# Patient Record
Sex: Female | Born: 1980
Health system: Southern US, Community
[De-identification: ages and names within clinical notes are randomized; demographics above are authoritative.]

## PROBLEM LIST (undated history)

## (undated) DIAGNOSIS — R109 Unspecified abdominal pain: Secondary | ICD-10-CM

## (undated) DIAGNOSIS — Z8719 Personal history of other diseases of the digestive system: Secondary | ICD-10-CM

## (undated) DIAGNOSIS — M1990 Unspecified osteoarthritis, unspecified site: Secondary | ICD-10-CM

## (undated) DIAGNOSIS — K219 Gastro-esophageal reflux disease without esophagitis: Secondary | ICD-10-CM

## (undated) HISTORY — DX: Unspecified osteoarthritis, unspecified site: M19.90

## (undated) HISTORY — PX: WISDOM TOOTH EXTRACTION: SHX21

## (undated) HISTORY — PX: APPENDECTOMY: SHX54

## (undated) HISTORY — DX: Unspecified abdominal pain: R10.9

## (undated) HISTORY — PX: EYE SURGERY: SHX253

---

## 2008-09-25 ENCOUNTER — Inpatient Hospital Stay (HOSPITAL_COMMUNITY): Admission: AD | Admit: 2008-09-25 | Discharge: 2008-09-27 | Payer: Self-pay | Admitting: Obstetrics

## 2011-01-06 ENCOUNTER — Inpatient Hospital Stay (HOSPITAL_COMMUNITY)
Admission: AD | Admit: 2011-01-06 | Discharge: 2011-01-09 | Payer: Self-pay | Source: Home / Self Care | Attending: Obstetrics and Gynecology | Admitting: Obstetrics and Gynecology

## 2011-01-07 LAB — CBC
HCT: 32.1 % — ABNORMAL LOW (ref 36.0–46.0)
Hemoglobin: 10.2 g/dL — ABNORMAL LOW (ref 12.0–15.0)
MCH: 24.3 pg — ABNORMAL LOW (ref 26.0–34.0)
MCHC: 31.8 g/dL (ref 30.0–36.0)
MCV: 76.4 fL — ABNORMAL LOW (ref 78.0–100.0)
Platelets: 227 10*3/uL (ref 150–400)
RBC: 4.2 MIL/uL (ref 3.87–5.11)
RDW: 16 % — ABNORMAL HIGH (ref 11.5–15.5)
WBC: 14.6 10*3/uL — ABNORMAL HIGH (ref 4.0–10.5)

## 2011-01-09 LAB — RPR: RPR Ser Ql: NONREACTIVE

## 2011-01-09 LAB — CBC
HCT: 24.8 % — ABNORMAL LOW (ref 36.0–46.0)
Hemoglobin: 7.8 g/dL — ABNORMAL LOW (ref 12.0–15.0)
MCH: 24.5 pg — ABNORMAL LOW (ref 26.0–34.0)
MCHC: 31.5 g/dL (ref 30.0–36.0)
MCV: 78 fL (ref 78.0–100.0)
Platelets: 173 10*3/uL (ref 150–400)
RBC: 3.18 MIL/uL — ABNORMAL LOW (ref 3.87–5.11)
RDW: 16.3 % — ABNORMAL HIGH (ref 11.5–15.5)
WBC: 14.6 10*3/uL — ABNORMAL HIGH (ref 4.0–10.5)

## 2011-01-15 ENCOUNTER — Inpatient Hospital Stay: Admission: RE | Admit: 2011-01-15 | Payer: Self-pay | Source: Home / Self Care | Admitting: Obstetrics

## 2011-01-18 NOTE — Discharge Summary (Addendum)
Casey Larson, Casey Larson            ACCOUNT NO.:  [id number]  MEDICAL RECORD NO.:  [id number]          PATIENT TYPE:  INP  LOCATION:  9109                          FACILITY:  [facility]  PHYSICIAN:  Genia Del, M.D.DATE OF BIRTH:  [birth year]  DATE OF ADMISSION:  01/06/2011 DATE OF DISCHARGE:  01/09/2011                              DISCHARGE SUMMARY   ADMITTING DIAGNOSES:  Term pregnancy, previous cesarean section x2, in labor.  DISCHARGE DIAGNOSIS:  Repeat cesarean section, postop day #2, stable.  PATIENT HISTORY:  The patient is a gravida 3, para 2-0-0-2 at 74 weeks and 3 days' day gestation with a due date of January 17, 2011, prenatal care at [facility] since 7 weeks.  Primary, Dr. Ernestina Penna.  PRENATAL LABORATORY FINDINGS:  A positive, rubella immune, GBS negative, HIV negative, RPR nonreactive, hepatitis B negative and GTT 133.  PRENATAL COURSE: Prenatal course complicated by a previous C-section x2.MEDICAL AND SURGICAL HISTORY:  The patient has a known history of GERD and pubis symphysis disruption, previous C-section x2.  ALLERGIES:  PENICILLIN.  CURRENT MEDICATIONS:  Prenatal vitamins, Zantac and Refresh eye drops.  PRESENTATION:  The patient presents to hospital on January 15 at 9:20 p.m. with contractions, reassuring fetal heart rate.  Cervix was dilated 3, 80, on vertex.  The patient was observed for an additional 3 hours. At 1:10, she was found to be 6, completely effaced and vertex and the patient was prepped for urgent C-section.  ADMISSION LABORATORY FINDINGS:  WBCs 14.6, hemoglobin 10.2, hematocrit 32, platelets 227, RPR nonreactive.  DELIVERY:  The patient was delivered via C-section on January 07, 2011, by Dr. Seymour Bars for previous C-section x2 and active labor; see op note  for further details. The patient was delivered of a female infant, 8 pounds  9 ounces, Apgars 9 and 9. Newborn was transferred to regular nursery.    POST OP COURSE: Postop course was  complicated by ABL anemia and patient  was started on oral iron supplementation.  Postoperative labs;  WBCs 14.6, hemoglobin 7.8, hematocrit 24.8 and platelets 173.   Vital signs remained stable and afebrile throughout hospital stay.   Physical exam was within normal limits. Wound was approximated with  staples.  No erythema and no ecchymosis and no drainage.  Newborn was  bottle fed and underwent a circumcision during hospital stay.  DISCHARGE INSTRUCTIONS:  The patient was discharged home in stable condition, staples for removal on postop day #4 at [facility].  Diet, regular. Activity - postop weight lifting restrictions x2 weeks.  Instructions per [facility] booklet.  DISCHARGE MEDICATIONS: 1. Prenatal vitamins 1 tablet p.o. daily. 2. Colace 1 tablet p.o. daily. 3. Ibuprofen 600 mg p.o. q.6 h. p.r.n. for pain. 4. Tylox 1-2 tablets p.o. q.4-6 h. p.r.n. for pain. 5. Niferex 150 one tablet p.o. b.i.d.  The patient to follow up in 2 days for staple removal and 6 weeks postpartum visit.    ______________________________ Arlan Organ, CNM   ______________________________ Genia Del, M.D.    DP/MEDQ  D:  01/09/2011  T:  01/09/2011  Job:  [id number]  Electronically Signed by Arlan Organ CNM on 01/15/2011 01:11:28 PM Electronically Signed by  Genia Del M.D. on 01/18/2011 09:16:30 AM

## 2011-01-18 NOTE — Op Note (Addendum)
Casey Larson, Casey Larson            ACCOUNT NO.:  [id number]  MEDICAL RECORD NO.:  [id number]          PATIENT TYPE:  INP  LOCATION:  9109                          [facility]  [facility]  PHYSICIAN:  Genia Del, M.D.DATE OF BIRTH:  04/07/1981  DATE OF PROCEDURE:  01/06/2011 DATE OF DISCHARGE:                              OPERATIVE REPORT   PREOPERATIVE DIAGNOSES: 1. 38 weeks and 3 days, active labor. 2. Previous cesarean section.  POSTOPERATIVE DIAGNOSES: 1. 38 weeks and 3 days, active labor. 2. Previous cesarean section.  PROCEDURE:  Urgent repeat low transverse cesarean section.  SURGEON:  Genia Del, MD  ASSISTANT:  None.  DESCRIPTION OF PROCEDURE:  Under spinal anesthesia, the patient was in 15-degree left decubitus position.  She was prepped with the surgery prep on the abdomen and the Betadine on the vulvar and vaginal areas. The Foley was put in place in the bladder and the patient was draped as usual.  The level of anesthesia was verified and it was adequate.  We infiltrated the subcutaneous tissue with Marcaine one-quarter plain 10 mL at the site of the previous Pfannenstiel.  We make a Pfannenstiel incision at that level with a scalpel.  We opened the adipose tissue with a scalpel and then with the electrocautery.  We opened the aponeurosis transversely with Mayo scissors.  We then separate the recti muscles from the aponeurosis on the midline superiorly and inferiorly. We opened the parietal peritoneum with Metz scissors longitudinally.  We then opened the visceral peritoneum transversely with Jen Mow scissors.  We reclined the bladder downward.  We make a low transverse hysterotomy with the scalpel.  The membranes were ruptured, the amniotic fluid was clear and abundant.  We have a fetus in cephalic presentation.  Birth of a baby boy at 1:35.  The baby was suctioned.  The cord was clamped and cut.  The cord blood was taken.  The baby was given to the  neonatal team.  Apgars were 9 and 9, weight was 8 pounds 9 ounces.  The placenta was removed manually.  It was complete with three-vessel, it will be sent to Labor and Delivery.  Uterine revision done. Pitocin in the IV fluids.  The uterus contracts well.  Both ovaries and both tubes were normal to inspection.  Closure of the hysterotomy in a first full locked plane of Vicryl 0, a second layer was done in a mattress plane with Vicryl 0 as well.  Hemostasis was adequate at all levels.  We irrigated and suctioned the abdominopelvic cavities.  We closed the parietal peritoneum with a Vicryl 2-0 in a running suture.  We then completed hemostasis on the aponeurosis and recti muscles with the electrocautery. We closed a small defect on the aponeurosis with a Vicryl 0 in a figure- of-eight.  We then closed the aponeurosis with two-half running sutures of Vicryl 0.  Hemostasis was completed on the adipose tissue with the electrocautery.  We closed the adipose tissue space with separate stitches of plain.  We then reapproximated the skin with staples.  A dry dressing was applied.  The count of sponges and instruments was complete x2.  The  estimated blood loss was 800 mL.  The patient received Flagyl 500 mg IV at the beginning of the intervention.  No complications occurred and she was brought to the recovery room in good stable status.     Genia Del, M.D.     ML/MEDQ  D:  01/07/2011  T:  01/07/2011  Job:  [id number]  Electronically Signed by Genia Del M.D. on 01/18/2011 09:16:27 AM

## 2011-05-07 NOTE — Op Note (Signed)
Casey Larson, Casey Larson            ACCOUNT NO.:  [id number]   MEDICAL RECORD NO.:  [id number]          PATIENT TYPE:  INP   LOCATION:  9128                          [facility]  [facility]   PHYSICIAN:  Maxie Better, M.D.DATE OF BIRTH:  [birth year]   DATE OF PROCEDURE:  09/25/2008  DATE OF DISCHARGE:                               OPERATIVE REPORT   PREOPERATIVE DIAGNOSES:  Previous cesarean section, labor, term  gestation   PROCEDURE:  Repeat cesarean section Kerr hysterotomy.   POSTOPERATIVE DIAGNOSES:  Previous cesarean section in labor, term  gestation.   ANESTHESIA:  Spinal.   SURGEON:  Maxie Better, MD   ASSISTANT:  Marlinda Mike, CNM   INDICATIONS:  A 30 year old gravida 2, para 1 female at term, previous  cesarean section, presented with rupture of membranes in labor at 3 cm  who does not desire  attempted vaginal delivery.  The patient was  consented for repeat cesarean section.  Consent was signed after risks  reviewed.   PROCEDURE:  Under adequate epidural anesthesia, the patient was placed  in the supine position with a left lateral tilt.  She was sterilely  prepped and draped in usual fashion.  An indwelling Foley catheter was  sterilely placed.  A 0.25% Marcaine was injected along the previous  Pfannenstiel skin incision site.  Skin incision was then made, carried  down to the rectus fascia.  Rectus fascia was opened transversely.  Rectus fascia was then bluntly and sharply dissected off the rectus  muscle in superior and inferior fashion.  The rectus muscles was split  in midline.  The parietal peritoneum was entered bluntly and extended.  The vesicouterine peritoneum was opened transversely.  The lower uterine  segment was well developed.  The bladder was then bluntly dissected off  the lower uterine segment and displaced inferiorly.  A curvilinear low  transverse uterine incision was then made and extended with bandage  scissors.  Subsequent delivery of a  live female from the occiput  anterior position was accomplished.  Baby was bulb suctioned on the  abdomen.  The cord was clamped and cut.  The baby was transferred to the  awaiting pediatrician who assigned Apgars of 9 and 9 at 1 and 5 minutes.  Placenta was spontaneous intact, not sent to pathology.  Uterine cavity  was cleaned of debris.  Uterine incision had no extension and closed in  two layers, the first layer with 0 Monocryl running locked stitch and  second layer was imbricated using 0 Monocryl sutures.  Normal tubes and  ovaries were noted bilaterally.  The abdomen was copiously irrigated and  suctioned of debris.  The parietal peritoneum was closed with 2-0  Vicryl.  The rectus fascia was closed with 0 Vicryl x2.  The  subcutaneous area was irrigated.  Small bleeders were cauterized.  Interrupted 2-0 plain sutures were then placed.  The skin was  approximated with Ethicon staples.   SPECIMEN:  Placenta not sent.   ESTIMATED BLOOD LOSS:  500 mL.   URINE OUTPUT:  100 mL clear yellow urine.   INTRAOPERATIVE FLUID:  1800 mL, crystalloid.  Sponge and instrument counts x2 was correct.  Complication was none.  Weight of the baby was 6 pounds 4 ounces.  The patient tolerated  procedure well, was transferred to recovery in stable condition.      Maxie Better, M.D.  Electronically Signed     [place]/MEDQ  D:  09/25/2008  T:  09/25/2008  Job:  [id number]

## 2011-05-10 NOTE — Discharge Summary (Signed)
NAMEARTEMISIA, Larson            ACCOUNT NO.:  [id number]   MEDICAL RECORD NO.:  [id number]          PATIENT TYPE:  INP   LOCATION:  9128                          [facility]   PHYSICIAN:  Maxie Better, M.D.DATE OF BIRTH:  1981-01-10   DATE OF ADMISSION:  09/25/2008  DATE OF DISCHARGE:  09/27/2008                               DISCHARGE SUMMARY   ADMISSION DIAGNOSES:  Previous cesarean section, term gestation, and  labor.   DISCHARGE DIAGNOSES:  Term gestation delivered, labor, previous cesarean  section.   PROCEDURES:  Repeat cesarean section Kerr hysterotomy.   HISTORY OF PRESENT ILLNESS:  A 30 year old gravida 2, para 1 female with  known previous cesarean section scheduled for repeat cesarean section  who presented in labor with spontaneous ruptured membranes.   HOSPITAL COURSE:  The patient was admitted to [facility].  She was  taken to the operating room where she underwent a repeat cesarean  section.  The procedure resulted in delivery of a live female 6 pounds 4  ounces, Apgars of 9 and 9.  Normal tubes and ovaries were noted.  The  patient had a subsequent uncomplicated postoperative course and  requested early discharge on postop day #2.  Her CBC on postop day #1  showed a hemoglobin of 9.1, hematocrit 29.8, white count of 15.6, and  platelet count 219,000.  The patient is deemed well to be discharged  home.  Her incision had no evidence of an infection.   DISPOSITION:  Home.   CONDITION:  Stable.   DISCHARGE MEDICATIONS:  1. Motrin 800 mg every 8 hours p.r.n. pain.  2. Darvocet once 2 tablets every 4-6 hours p.r.n. pain.  3. Prenatal vitamins 1 p.o. daily.  4. Over-the-counter iron supplements 1 p.o. daily.   DISCHARGE INSTRUCTIONS:  Per the postpartum booklet given.  Followup  appointment at [facility] for staple removal in 6 weeks postpartum  otherwise.      Maxie Better, M.D.  Electronically Signed     [place]/MEDQ  D:   11/01/2008  T:  11/02/2008  Job:  [id number]

## 2011-09-23 LAB — CBC
HCT: 29.8 — ABNORMAL LOW
HCT: 34.9 — ABNORMAL LOW
Hemoglobin: 11.3 — ABNORMAL LOW
Hemoglobin: 9.9 — ABNORMAL LOW
MCHC: 32.4
MCHC: 33.2
MCV: 85.9
MCV: 87.6
Platelets: 219
Platelets: 292
RBC: 3.4 — ABNORMAL LOW
RBC: 4.06
RDW: 14.1
RDW: 14.6
WBC: 14.2 — ABNORMAL HIGH
WBC: 15.6 — ABNORMAL HIGH

## 2011-09-23 LAB — RPR: RPR Ser Ql: NONREACTIVE

## 2011-11-22 ENCOUNTER — Encounter: Payer: Self-pay | Admitting: Emergency Medicine

## 2011-11-22 ENCOUNTER — Emergency Department (INDEPENDENT_AMBULATORY_CARE_PROVIDER_SITE_OTHER)
Admission: EM | Admit: 2011-11-22 | Discharge: 2011-11-22 | Disposition: A | Discharge status: Other Acute Inpatient Hospital | Payer: BC Managed Care – PPO | Source: Home / Self Care | Attending: Emergency Medicine | Admitting: Emergency Medicine

## 2011-11-22 DIAGNOSIS — R102 Pelvic and perineal pain: Secondary | ICD-10-CM

## 2011-11-22 DIAGNOSIS — N949 Unspecified condition associated with female genital organs and menstrual cycle: Secondary | ICD-10-CM

## 2011-11-22 LAB — POCT URINALYSIS DIP (DEVICE)
Bilirubin Urine: NEGATIVE
Glucose, UA: NEGATIVE mg/dL
Nitrite: NEGATIVE
Protein, ur: NEGATIVE mg/dL
Specific Gravity, Urine: 1.025 (ref 1.005–1.030)
Urobilinogen, UA: 1 mg/dL (ref 0.0–1.0)
pH: 6.5 (ref 5.0–8.0)

## 2011-11-22 LAB — WET PREP, GENITAL
Trich, Wet Prep: NONE SEEN
Yeast Wet Prep HPF POC: NONE SEEN

## 2011-11-22 LAB — POCT PREGNANCY, URINE: Preg Test, Ur: NEGATIVE

## 2011-11-22 MED ORDER — IBUPROFEN 800 MG PO TABS: 800.0000 mg | ORAL_TABLET | Freq: Three times a day (TID) | ORAL | Status: AC

## 2011-11-22 MED ORDER — METRONIDAZOLE 500 MG PO TABS: 500.0000 mg | ORAL_TABLET | Freq: Three times a day (TID) | ORAL | Status: AC

## 2011-11-22 MED ORDER — DOXYCYCLINE HYCLATE 100 MG PO TABS: 100.0000 mg | ORAL_TABLET | Freq: Two times a day (BID) | ORAL | Status: AC

## 2011-11-22 NOTE — ED Notes (Signed)
Since Sat having pain in lower abdomen pelvic area mostly on left. Also radiates to back and hip on Left side.

## 2011-11-22 NOTE — ED Provider Notes (Signed)
History     CSN: [id number] Arrival date & time: 11/22/2011  9:10 PM   First MD Initiated Contact with Patient 11/22/11 1958      Chief Complaint  Patient presents with  . Abdominal Pain    (Consider location/radiation/quality/duration/timing/severity/associated sxs/prior treatment) HPI Comments: Casey Larson has had a one-week history of left lower quadrant abdominal pain radiating to the left lower back and down into the left thigh. The pain is a constant ache with occasional sharp stabbing pain. The pain is 8/10 in intensity at its worst and now is a 5/10. It's worse if she sits or if she urinates and better she stands up. Her last menstrual period was November 21. She has an IUD. Menses occur about every 3 months. She did have a history of an ovarian cyst when she was pregnant. Her gynecologist is Dr. Algie Coffer. She has allergies to penicillin and codeine. She denies any fever, chills, anorexia, weight loss, nausea, or vomiting. She's had no constipation, diarrhea, blood in the stool, or melena. She denies any dysuria, frequency, urgency, or blood in the urine. She has had no vaginal discharge or itching.  Patient is a 30 y.o. female presenting with abdominal pain.  Abdominal Pain The primary symptoms of the illness include abdominal pain. The primary symptoms of the illness do not include fever, nausea, vomiting, diarrhea, dysuria, vaginal discharge or vaginal bleeding.  Symptoms associated with the illness do not include chills, urgency, hematuria or frequency.    History reviewed. No pertinent past medical history.  Past Surgical History  Procedure Date  . Cesarean section 3x last being 01/12  . Eye surgery     History reviewed. No pertinent family history.  History  Substance Use Topics  . Smoking status: Not on file  . Smokeless tobacco: Not on file  . Alcohol Use: No    OB History    Grav Para Term Preterm Abortions TAB SAB Ect Mult Living                  Review of  Systems  Constitutional: Negative for fever and chills.  Gastrointestinal: Positive for abdominal pain. Negative for nausea, vomiting and diarrhea.  Genitourinary: Negative for dysuria, urgency, frequency, hematuria, vaginal bleeding, vaginal discharge, difficulty urinating, genital sores, vaginal pain, menstrual problem, pelvic pain and dyspareunia.    Allergies  Codeine and Penicillins  Home Medications   Current Outpatient Rx  Name Route Sig Dispense Refill  . DOXYCYCLINE HYCLATE 100 MG PO TABS Oral Take 1 tablet (100 mg total) by mouth 2 (two) times daily. 20 tablet 0  . IBUPROFEN 800 MG PO TABS Oral Take 1 tablet (800 mg total) by mouth 3 (three) times daily. 21 tablet 0  . METRONIDAZOLE 500 MG PO TABS Oral Take 1 tablet (500 mg total) by mouth 3 (three) times daily. 30 tablet 0    BP 113/75  Pulse 85  Temp(Src) 97.7 F (36.5 C) (Oral)  Resp 18  SpO2 100%  LMP 11/13/2011  Physical Exam  Nursing note and vitals reviewed. Constitutional: She appears well-developed and well-nourished. No distress.  Cardiovascular: Normal rate, regular rhythm, normal heart sounds and intact distal pulses.  Exam reveals no gallop and no friction rub.   No murmur heard. Pulmonary/Chest: Effort normal and breath sounds normal. No respiratory distress. She has no wheezes. She has no rales.  Abdominal: Soft. Bowel sounds are normal. She exhibits no distension and no mass. There is no tenderness. There is no rebound and no guarding.  Genitourinary: Uterus normal. There is no rash or lesion on the right labia. There is no rash or lesion on the left labia. Uterus is not deviated, not enlarged, not fixed and not tender. Cervix exhibits discharge. Cervix exhibits no motion tenderness and no friability. Right adnexum displays no mass and no tenderness. Left adnexum displays tenderness. Left adnexum displays no mass. No erythema or bleeding around the vagina. No foreign body around the vagina. Vaginal discharge  found.       On pelvic exam, external genitalia were normal. She has a thick, homogeneous, white, non-malodorous discharge. She does have an IUD string in place. There was no cervical motion tenderness. Uterus was midposition, normal in size and shape and was nontender. There is no right adnexal tenderness or mass. She has a moderate left adnexal tenderness but no mass.  Skin: Skin is warm and dry. No rash noted. She is not diaphoretic.    ED Course  Procedures (including critical care time)  Results for orders placed during the hospital encounter of 11/22/11  POCT URINALYSIS DIP (DEVICE)      Component Value Range   Glucose, UA NEGATIVE  NEGATIVE (mg/dL)   Bilirubin Urine NEGATIVE  NEGATIVE    Ketones, ur TRACE (*) NEGATIVE (mg/dL)   Specific Gravity, Urine 1.025  1.005 - 1.030    Hgb urine dipstick SMALL (*) NEGATIVE    pH 6.5  5.0 - 8.0    Protein, ur NEGATIVE  NEGATIVE (mg/dL)   Urobilinogen, UA 1.0  0.0 - 1.0 (mg/dL)   Nitrite NEGATIVE  NEGATIVE    Leukocytes, UA TRACE (*) NEGATIVE   POCT PREGNANCY, URINE      Component Value Range   Preg Test, Ur NEGATIVE       Labs Reviewed  POCT URINALYSIS DIP (DEVICE) - Abnormal; Notable for the following:    Ketones, ur TRACE (*)    Hgb urine dipstick SMALL (*)    Leukocytes, UA TRACE (*) Biochemical Testing Only. Please order routine urinalysis from main lab if confirmatory testing is needed.   All other components within normal limits  POCT PREGNANCY, URINE  POCT PREGNANCY, URINE  POCT URINALYSIS DIPSTICK  WET PREP, GENITAL  GC/CHLAMYDIA PROBE AMP, GENITAL   No results found.   1. Pelvic pain in female       MDM  Her clinical picture and examination are consistent with either an ovarian cyst that has ruptured or with a tubal infection. I told her I could not make the differentiation without ultrasound. For right now we'll go ahead and start on antibiotics and have her followup with her gynecologist on Monday. If the pain  gets worse over the weekend I suggested she go to [facility] where they can do an ultrasound.        Roque Lias, MD 11/22/11 [phone]

## 2011-11-25 LAB — GC/CHLAMYDIA PROBE AMP, GENITAL
Chlamydia, DNA Probe: NEGATIVE
GC Probe Amp, Genital: NEGATIVE

## 2011-11-25 NOTE — ED Notes (Addendum)
Wet prep: Clue cells TNTC  GC/Chlamydia neg.  Pt. adequately treated with Flagyl for bacterial vaginosis. [name] 11/25/2011  .

## 2012-10-02 ENCOUNTER — Encounter: Payer: Self-pay | Admitting: Family

## 2012-10-02 ENCOUNTER — Ambulatory Visit (INDEPENDENT_AMBULATORY_CARE_PROVIDER_SITE_OTHER): Payer: BC Managed Care – PPO | Admitting: Family

## 2012-10-02 VITALS — BP 104/70 | HR 75 | Temp 97.9°F | Resp 12 | Ht 64.0 in | Wt 133.0 lb

## 2012-10-02 DIAGNOSIS — N63 Unspecified lump in unspecified breast: Secondary | ICD-10-CM

## 2012-10-02 DIAGNOSIS — J069 Acute upper respiratory infection, unspecified: Secondary | ICD-10-CM

## 2012-10-02 DIAGNOSIS — N6320 Unspecified lump in the left breast, unspecified quadrant: Secondary | ICD-10-CM

## 2012-10-02 MED ORDER — LEVOFLOXACIN 500 MG PO TABS: 500.0000 mg | ORAL_TABLET | Freq: Every day | ORAL | Status: DC

## 2012-10-02 NOTE — Assessment & Plan Note (Signed)
Nasal congestion is most consistent with URI, though I have instructed her to start levaquin if symptoms worsen or if no improvement by Monday. Pt verbalizes understanding.

## 2012-10-02 NOTE — Assessment & Plan Note (Signed)
No discrete mass noted today on exam, however will go ahead and order left diagnostic mammogram.  She reports husband felt mass last night.  Resolving cyst is a possibility.

## 2012-10-02 NOTE — Patient Instructions (Addendum)
You will be contacted about your mammogram.  Please call if you have not heard back about your appointment by Tuesday. Please start antibiotics if sinus congestion is not improved by 10/14, sooner if you develop green/yellow nasal discharge or if you develop fever.  Please schedule a fasting physical at your convenience.

## 2012-10-02 NOTE — Progress Notes (Signed)
Subjective:    Patient ID: Casey Larson, female    DOB: 24-Mar-1981, 31 y.o.   MRN: [id number]  HPI  Ms. Casey Larson is a 31 yr old female who presents today to establish care. She has two concerns.  1) lump in left breast- first noticed on 10/4. Left breast under the breast.  Lump is tenderness.  No associated redness or drainage from the  Left breast. Denies family hx of breast cancer.    2) Sinus problem-  Reports 5 day history of nasal congestion-  Reports nasal congestion is clear. She felt cold on Monday and Tuesday but did not take temperature.  She has tried dayquil, Careers adviser D without much improvement. Tried advil for HA which helped.     Review of Systems  Constitutional: Negative for unexpected weight change.  HENT: Negative for hearing loss.   Eyes: Negative for visual disturbance.  Respiratory: Negative for chest tightness and shortness of breath.   Cardiovascular: Negative for leg swelling.  Gastrointestinal: Negative for nausea, vomiting, diarrhea and constipation.  Genitourinary: Negative for frequency and hematuria.  Musculoskeletal: Negative for myalgias and arthralgias.  Skin: Negative for rash.  Neurological: Positive for headaches.  Psychiatric/Behavioral:       Denies depression/anxiety   History reviewed. No pertinent past medical history.  History   Social History  . Marital Status: Married    Spouse Name: N/A    Number of Children: 3  . Years of Education: N/A   Occupational History  .     Social History Main Topics  . Smoking status: Never Smoker   . Smokeless tobacco: Not on file  . Alcohol Use: No  . Drug Use: No  . Sexually Active: Yes    Birth Control/ Protection: IUD     IUD placed 01/2011   Other Topics Concern  . Not on file   Social History Narrative   Regular exercise: noCaffeine use: tea 3 to 4 x a weekStay at home mom- 3 childrenCompleted 2 associates degree.Married.    Past Surgical History  Procedure Date  . Cesarean  section 3x last being 01/12  . Eye surgery     lasik    Family History  Problem Relation Age of Onset  . Diabetes Father   . Cancer Maternal Grandmother     lung cancer    Allergies  Allergen Reactions  . Codeine     Syncope   . Penicillins Hives    No current outpatient prescriptions on file prior to visit.    BP 104/70  Pulse 75  Temp 97.9 F (36.6 C) (Oral)  Resp 12  Ht 5\' 4"  (1.626 m)  Wt 133 lb 0.6 oz (60.347 kg)  BMI 22.84 kg/m2  SpO2 99%  LMP 09/28/2012       Objective:   Physical Exam  Constitutional: She is oriented to person, place, and time. She appears well-developed and well-nourished. No distress.  Cardiovascular: Normal rate and regular rhythm.   No murmur heard. Pulmonary/Chest: Effort normal and breath sounds normal. No respiratory distress. She has no wheezes. She has no rales. She exhibits no tenderness.  Genitourinary:       Bilateral cystic breasts are noted.  No discrete left breast mass is palpable today, though she does report some tenderness.  Musculoskeletal: She exhibits no edema.  Neurological: She is alert and oriented to person, place, and time.  Skin: Skin is warm and dry.  Psychiatric: She has a normal mood and affect. Her behavior is  normal. Judgment and thought content normal.          Assessment & Plan:

## 2012-10-08 ENCOUNTER — Other Ambulatory Visit: Payer: Self-pay | Admitting: Family

## 2012-10-08 DIAGNOSIS — N6320 Unspecified lump in the left breast, unspecified quadrant: Secondary | ICD-10-CM

## 2012-10-12 ENCOUNTER — Ambulatory Visit
Admission: RE | Admit: 2012-10-12 | Discharge: 2012-10-12 | Disposition: A | Discharge status: Home / Self Care | Payer: BC Managed Care – PPO | Source: Ambulatory Visit | Attending: Family | Admitting: Family

## 2012-10-12 DIAGNOSIS — N6320 Unspecified lump in the left breast, unspecified quadrant: Secondary | ICD-10-CM

## 2012-10-13 ENCOUNTER — Telehealth: Payer: Self-pay | Admitting: *Deleted

## 2012-10-13 NOTE — Telephone Encounter (Signed)
Left detailed voice message for pt on home voice mail that her breast imaging is normal.

## 2014-03-10 ENCOUNTER — Encounter: Payer: Self-pay | Admitting: Physician Assistant

## 2014-03-10 ENCOUNTER — Ambulatory Visit (INDEPENDENT_AMBULATORY_CARE_PROVIDER_SITE_OTHER): Payer: BC Managed Care – PPO | Admitting: Physician Assistant

## 2014-03-10 VITALS — BP 104/76 | HR 89 | Temp 98.2°F | Resp 16 | Ht 64.0 in | Wt 137.0 lb

## 2014-03-10 DIAGNOSIS — R42 Dizziness and giddiness: Secondary | ICD-10-CM

## 2014-03-10 DIAGNOSIS — R110 Nausea: Secondary | ICD-10-CM

## 2014-03-10 DIAGNOSIS — R55 Syncope and collapse: Secondary | ICD-10-CM

## 2014-03-10 LAB — CBC WITH DIFFERENTIAL/PLATELET
Basophils Absolute: 0 10*3/uL (ref 0.0–0.1)
Basophils Relative: 0 % (ref 0–1)
Eosinophils Absolute: 0.2 10*3/uL (ref 0.0–0.7)
Eosinophils Relative: 2 % (ref 0–5)
HCT: 39.3 % (ref 36.0–46.0)
Hemoglobin: 13.6 g/dL (ref 12.0–15.0)
Lymphocytes Relative: 28 % (ref 12–46)
Lymphs Abs: 2.4 10*3/uL (ref 0.7–4.0)
MCH: 30.2 pg (ref 26.0–34.0)
MCHC: 34.6 g/dL (ref 30.0–36.0)
MCV: 87.3 fL (ref 78.0–100.0)
Monocytes Absolute: 0.5 10*3/uL (ref 0.1–1.0)
Monocytes Relative: 6 % (ref 3–12)
Neutro Abs: 5.4 10*3/uL (ref 1.7–7.7)
Neutrophils Relative %: 64 % (ref 43–77)
Platelets: 274 10*3/uL (ref 150–400)
RBC: 4.5 MIL/uL (ref 3.87–5.11)
RDW: 13.9 % (ref 11.5–15.5)
WBC: 8.4 10*3/uL (ref 4.0–10.5)

## 2014-03-10 LAB — COMPREHENSIVE METABOLIC PANEL
ALT: 9 U/L (ref 0–35)
AST: 12 U/L (ref 0–37)
Albumin: 4.2 g/dL (ref 3.5–5.2)
Alkaline Phosphatase: 56 U/L (ref 39–117)
BUN: 9 mg/dL (ref 6–23)
CO2: 28 mEq/L (ref 19–32)
Calcium: 9 mg/dL (ref 8.4–10.5)
Chloride: 104 mEq/L (ref 96–112)
Creat: 0.63 mg/dL (ref 0.50–1.10)
Glucose, Bld: 103 mg/dL — ABNORMAL HIGH (ref 70–99)
Potassium: 4.2 mEq/L (ref 3.5–5.3)
Sodium: 137 mEq/L (ref 135–145)
Total Bilirubin: 0.4 mg/dL (ref 0.2–1.2)
Total Protein: 7.4 g/dL (ref 6.0–8.3)

## 2014-03-10 LAB — SEDIMENTATION RATE: Sed Rate: 4 mm/hr (ref 0–22)

## 2014-03-10 NOTE — Progress Notes (Signed)
Pre visit review using our clinic review tool, if applicable. No additional management support is needed unless otherwise documented below in the visit note/SLS  

## 2014-03-10 NOTE — Patient Instructions (Signed)
Please increase fluid intake.  Drink 1 Gatorade per day.  Please obtain labs.  I will call you with your results.  Watch for your symptoms -- nausea or lightheadedness.  If this occurs, sit down and have some one bring you a water.  If labs are good and increasing hydration are not helping, we will send you to Neurology for a further evaluation.

## 2014-03-10 NOTE — Progress Notes (Signed)
Patient presents to clinic today c/o episodes of lightheadedness, nausea, tunnel vision and weakness that have been occuring since September 2014.  Episodes are infrequent.  Patient states they usually occur if she gets too hot or if she is stressed.  Denies facial drooping, AMS, or focal neurologic deficit.  Denies hx of seizure, HTN, HLD, CVD.  Denies hx of seizure.  Denies syncope.  Symptoms usually resolve in 10-15 minutes and after she has sat down and been given something to drink.  Patient and husband both endorse that the patient has very poor fluid intake and a hx of iron deficiency anemia. States she may have 1/2-1 glass of fluids per day.  Does endorse eating regularly.   No past medical history on file.  No current outpatient prescriptions on file prior to visit.   No current facility-administered medications on file prior to visit.    Allergies  Allergen Reactions  . Codeine     Syncope   . Penicillins Hives    Family History  Problem Relation Age of Onset  . Diabetes Father   . Cancer Maternal Grandmother     lung cancer    History   Social History  . Marital Status: Married    Spouse Name: N/A    Number of Children: 3  . Years of Education: N/A   Occupational History  .     Social History Main Topics  . Smoking status: Never Smoker   . Smokeless tobacco: None  . Alcohol Use: No  . Drug Use: No  . Sexual Activity: Yes    Birth Control/ Protection: IUD     Comment: IUD placed 01/2011   Other Topics Concern  . 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   Married.         Review of Systems - See HPI.  All other ROS are negative.  BP 104/76  Pulse 89  Temp(Src) 98.2 F (36.8 C) (Oral)  Resp 16  Ht '5\' 4"'  (1.626 m)  Wt 137 lb (62.143 kg)  BMI 23.50 kg/m2  SpO2 97%  LMP 03/05/2014  Physical Exam  Vitals reviewed. Constitutional:  She is oriented to person, place, and time and well-developed, well-nourished, and in no distress.  HENT:  Head: Normocephalic and atraumatic.  Right Ear: External ear normal.  Left Ear: External ear normal.  Nose: Nose normal.  Mouth/Throat: Oropharynx is clear and moist. No oropharyngeal exudate.  TM within normal limits bilaterally.  Eyes: Conjunctivae are normal. Pupils are equal, round, and reactive to light.  Neck: Neck supple.  Cardiovascular: Normal rate, regular rhythm, S1 normal, S2 normal, normal heart sounds and intact distal pulses.  Exam reveals no S3 and no S4.   Pulses:      Carotid pulses are 2+ on the right side, and 2+ on the left side. No carotid bruit auscultated on exam.  Pulmonary/Chest: Effort normal and breath sounds normal. No respiratory distress. She has no wheezes. She has no rales. She exhibits no tenderness.  Musculoskeletal: Normal range of motion.  Neurological: She is alert and oriented to person, place, and time. She has normal reflexes. No cranial nerve deficit. Gait normal. GCS score is 15.  Skin: Skin is warm and dry. No rash noted.  Psychiatric: Mood, memory, affect and judgment normal.    Recent Results (from  the past 2160 hour(s))  CBC WITH DIFFERENTIAL     Status: None   Collection Time    03/10/14  4:06 PM      Result Value Ref Range   WBC 8.4  4.0 - 10.5 K/uL   RBC 4.50  3.87 - 5.11 MIL/uL   Hemoglobin 13.6  12.0 - 15.0 g/dL   HCT 39.3  36.0 - 46.0 %   MCV 87.3  78.0 - 100.0 fL   MCH 30.2  26.0 - 34.0 pg   MCHC 34.6  30.0 - 36.0 g/dL   RDW 13.9  11.5 - 15.5 %   Platelets 274  150 - 400 K/uL   Neutrophils Relative % 64  43 - 77 %   Neutro Abs 5.4  1.7 - 7.7 K/uL   Lymphocytes Relative 28  12 - 46 %   Lymphs Abs 2.4  0.7 - 4.0 K/uL   Monocytes Relative 6  3 - 12 %   Monocytes Absolute 0.5  0.1 - 1.0 K/uL   Eosinophils Relative 2  0 - 5 %   Eosinophils Absolute 0.2  0.0 - 0.7 K/uL   Basophils Relative 0  0 - 1 %   Basophils Absolute  0.0  0.0 - 0.1 K/uL   Smear Review Criteria for review not met    COMPREHENSIVE METABOLIC PANEL     Status: Abnormal   Collection Time    03/10/14  4:06 PM      Result Value Ref Range   Sodium 137  135 - 145 mEq/L   Potassium 4.2  3.5 - 5.3 mEq/L   Chloride 104  96 - 112 mEq/L   CO2 28  19 - 32 mEq/L   Glucose, Bld 103 (*) 70 - 99 mg/dL   BUN 9  6 - 23 mg/dL   Creat 0.63  0.50 - 1.10 mg/dL   Total Bilirubin 0.4  0.2 - 1.2 mg/dL   Alkaline Phosphatase 56  39 - 117 U/L   AST 12  0 - 37 U/L   ALT 9  0 - 35 U/L   Total Protein 7.4  6.0 - 8.3 g/dL   Albumin 4.2  3.5 - 5.2 g/dL   Calcium 9.0  8.4 - 10.5 mg/dL  SEDIMENTATION RATE     Status: None   Collection Time    03/10/14  4:06 PM      Result Value Ref Range   Sed Rate 4  0 - 22 mm/hr    Assessment/Plan: Vasovagal near syncope Symptoms seem consistent with Vasovagal Episodes.  Physical examination unremarkable.Episodes are likely due to extremely poor hydration combined with triggers.  Patient also with Hx of IDA.  Increase fluid intake to 6-8 glasses of fluid per day.  Recommend 1 Gatorade per day.  Will obtain labs due to history of anemia. Follow-up in 1 month.  Return sooner if symptoms recur despite increased hydration.

## 2014-03-12 DIAGNOSIS — R55 Syncope and collapse: Secondary | ICD-10-CM | POA: Insufficient documentation

## 2014-03-12 NOTE — Assessment & Plan Note (Signed)
Symptoms seem consistent with Vasovagal Episodes.  Physical examination unremarkable.Episodes are likely due to extremely poor hydration combined with triggers.  Patient also with Hx of IDA.  Increase fluid intake to 6-8 glasses of fluid per day.  Recommend 1 Gatorade per day.  Will obtain labs due to history of anemia. Follow-up in 1 month.  Return sooner if symptoms recur despite increased hydration.

## 2016-12-19 ENCOUNTER — Ambulatory Visit (HOSPITAL_COMMUNITY)
Admission: EM | Admit: 2016-12-19 | Discharge: 2016-12-19 | Disposition: A | Discharge status: Home / Self Care | Payer: BLUE CROSS/BLUE SHIELD | Attending: Family Medicine | Admitting: Family Medicine

## 2016-12-19 ENCOUNTER — Encounter (HOSPITAL_COMMUNITY): Payer: Self-pay | Admitting: Family Medicine

## 2016-12-19 DIAGNOSIS — J40 Bronchitis, not specified as acute or chronic: Secondary | ICD-10-CM | POA: Diagnosis not present

## 2016-12-19 DIAGNOSIS — R05 Cough: Secondary | ICD-10-CM

## 2016-12-19 DIAGNOSIS — R059 Cough, unspecified: Secondary | ICD-10-CM

## 2016-12-19 MED ORDER — AZITHROMYCIN 250 MG PO TABS
250.0000 mg | ORAL_TABLET | Freq: Every day | ORAL | 0 refills | Status: DC
Start: 2016-12-19 — End: 2017-12-30

## 2016-12-19 MED ORDER — BENZONATATE 100 MG PO CAPS: 200.0000 mg | ORAL_CAPSULE | Freq: Three times a day (TID) | ORAL | 0 refills | Status: DC | PRN

## 2016-12-19 MED ORDER — IPRATROPIUM BROMIDE 0.06 % NA SOLN: 2.0000 | Freq: Four times a day (QID) | NASAL | 12 refills | Status: DC

## 2016-12-19 NOTE — ED Provider Notes (Signed)
CSN: [id number]     Arrival date & time 12/19/16  1307 History   First MD Initiated Contact with Patient 12/19/16 1447     Chief Complaint  Patient presents with  . Cough  . Chills   (Consider location/radiation/quality/duration/timing/severity/associated sxs/prior Treatment) Patient c/o uri cough congestion and feeling washed out.  She has hoarse voice.    URI  Presenting symptoms: congestion, cough, fatigue and rhinorrhea   Severity:  Moderate Onset quality:  Sudden Duration:  1 week Timing:  Constant Progression:  Worsening Chronicity:  New Relieved by:  Nothing Worsened by:  Nothing   History reviewed. No pertinent past medical history. Past Surgical History:  Procedure Laterality Date  . CESAREAN SECTION  3x last being 01/12  . EYE SURGERY     lasik   Family History  Problem Relation Age of Onset  . Diabetes Father   . Cancer Maternal Grandmother     lung cancer   Social History  Substance Use Topics  . Smoking status: Never Smoker  . Smokeless tobacco: Never Used  . Alcohol use No   OB History    No data available     Review of Systems  Constitutional: Positive for fatigue.  HENT: Positive for congestion and rhinorrhea.   Eyes: Negative.   Respiratory: Positive for cough.   Cardiovascular: Negative.   Gastrointestinal: Negative.   Endocrine: Negative.   Genitourinary: Negative.   Musculoskeletal: Negative.   Allergic/Immunologic: Negative.   Neurological: Negative.   Hematological: Negative.   Psychiatric/Behavioral: Negative.     Allergies  Codeine and Penicillins  Home Medications   Prior to Admission medications   Medication Sig Start Date End Date Taking? Authorizing Provider  azithromycin (ZITHROMAX) 250 MG tablet Take 1 tablet (250 mg total) by mouth daily. Take first 2 tablets together, then 1 every day until finished. 12/19/16   Deatra CanterWilliam J Oxford, FNP  benzonatate (TESSALON) 100 MG capsule Take 2 capsules (200 mg total) by mouth 3  (three) times daily as needed for cough. 12/19/16   Deatra CanterWilliam J Oxford, FNP  ipratropium (ATROVENT) 0.06 % nasal spray Place 2 sprays into both nostrils 4 (four) times daily. 12/19/16   Deatra CanterWilliam J Oxford, FNP   Meds Ordered and Administered this Visit  Medications - No data to display  BP 98/77   Pulse 108   Temp 98.2 F (36.8 C)   Resp 18   SpO2 100%  No data found.   Physical Exam  Constitutional: She is oriented to person, place, and time. She appears well-developed and well-nourished.  HENT:  Head: Normocephalic and atraumatic.  Right Ear: External ear normal.  Left Ear: External ear normal.  Mouth/Throat: Oropharynx is clear and moist.  Eyes: Conjunctivae and EOM are normal. Pupils are equal, round, and reactive to light.  Neck: Normal range of motion. Neck supple.  Cardiovascular: Normal rate, regular rhythm and normal heart sounds.   Pulmonary/Chest: Effort normal and breath sounds normal.  Abdominal: Soft. Bowel sounds are normal.  Neurological: She is alert and oriented to person, place, and time.  Nursing note and vitals reviewed.   Urgent Care Course   Clinical Course     Procedures (including critical care time)  Labs Review Labs Reviewed - No data to display  Imaging Review No results found.   Visual Acuity Review  Right Eye Distance:   Left Eye Distance:   Bilateral Distance:    Right Eye Near:   Left Eye Near:    Bilateral Near:  MDM   1. Bronchitis   2. Cough    zpak as directed atrovent nasal spray Tessalon perles  Push po fluids, rest, tylenol and motrin otc prn as directed for fever, arthralgias, and myalgias.  Follow up prn if sx's continue or persist.    Deatra CanterWilliam J Oxford, FNP 12/19/16 1538

## 2016-12-19 NOTE — ED Triage Notes (Signed)
Pt here for flu like symptoms. sts congestion x 2 weeks.

## 2017-12-30 ENCOUNTER — Inpatient Hospital Stay (HOSPITAL_COMMUNITY)
Admission: EM | Admit: 2017-12-30 | Discharge: 2018-01-01 | DRG: 343 | Disposition: A | Discharge status: Home / Self Care | Payer: BLUE CROSS/BLUE SHIELD | Attending: General Surgery | Admitting: General Surgery

## 2017-12-30 ENCOUNTER — Emergency Department (HOSPITAL_COMMUNITY): Payer: BLUE CROSS/BLUE SHIELD

## 2017-12-30 ENCOUNTER — Encounter (HOSPITAL_COMMUNITY): Payer: Self-pay | Admitting: *Deleted

## 2017-12-30 DIAGNOSIS — Z833 Family history of diabetes mellitus: Secondary | ICD-10-CM

## 2017-12-30 DIAGNOSIS — R1110 Vomiting, unspecified: Secondary | ICD-10-CM | POA: Diagnosis not present

## 2017-12-30 DIAGNOSIS — K3580 Unspecified acute appendicitis: Secondary | ICD-10-CM | POA: Diagnosis not present

## 2017-12-30 DIAGNOSIS — Z801 Family history of malignant neoplasm of trachea, bronchus and lung: Secondary | ICD-10-CM | POA: Diagnosis not present

## 2017-12-30 DIAGNOSIS — R1031 Right lower quadrant pain: Secondary | ICD-10-CM | POA: Diagnosis not present

## 2017-12-30 DIAGNOSIS — Z880 Allergy status to penicillin: Secondary | ICD-10-CM | POA: Diagnosis not present

## 2017-12-30 DIAGNOSIS — Z885 Allergy status to narcotic agent status: Secondary | ICD-10-CM

## 2017-12-30 DIAGNOSIS — K37 Unspecified appendicitis: Secondary | ICD-10-CM | POA: Diagnosis present

## 2017-12-30 DIAGNOSIS — K3589 Other acute appendicitis without perforation or gangrene: Secondary | ICD-10-CM | POA: Diagnosis not present

## 2017-12-30 DIAGNOSIS — R110 Nausea: Secondary | ICD-10-CM | POA: Diagnosis not present

## 2017-12-30 DIAGNOSIS — R109 Unspecified abdominal pain: Secondary | ICD-10-CM | POA: Diagnosis not present

## 2017-12-30 DIAGNOSIS — R6883 Chills (without fever): Secondary | ICD-10-CM | POA: Diagnosis not present

## 2017-12-30 LAB — URINALYSIS, ROUTINE W REFLEX MICROSCOPIC
BILIRUBIN URINE: NEGATIVE
GLUCOSE, UA: NEGATIVE mg/dL
KETONES UR: NEGATIVE mg/dL
LEUKOCYTES UA: NEGATIVE
Nitrite: NEGATIVE
Protein, ur: NEGATIVE mg/dL
Specific Gravity, Urine: 1.008 (ref 1.005–1.030)
pH: 6 (ref 5.0–8.0)

## 2017-12-30 LAB — CBC WITH DIFFERENTIAL/PLATELET
BASOS ABS: 0 10*3/uL (ref 0.0–0.1)
BASOS PCT: 0 %
EOS PCT: 1 %
Eosinophils Absolute: 0.1 10*3/uL (ref 0.0–0.7)
HEMATOCRIT: 42.1 % (ref 36.0–46.0)
Hemoglobin: 14.1 g/dL (ref 12.0–15.0)
Lymphocytes Relative: 26 %
Lymphs Abs: 2.5 10*3/uL (ref 0.7–4.0)
MCH: 30.5 pg (ref 26.0–34.0)
MCHC: 33.5 g/dL (ref 30.0–36.0)
MCV: 91.1 fL (ref 78.0–100.0)
MONO ABS: 0.5 10*3/uL (ref 0.1–1.0)
MONOS PCT: 5 %
NEUTROS ABS: 6.7 10*3/uL (ref 1.7–7.7)
Neutrophils Relative %: 68 %
PLATELETS: 294 10*3/uL (ref 150–400)
RBC: 4.62 MIL/uL (ref 3.87–5.11)
RDW: 13.4 % (ref 11.5–15.5)
WBC: 9.7 10*3/uL (ref 4.0–10.5)

## 2017-12-30 LAB — PREGNANCY, URINE: PREG TEST UR: NEGATIVE

## 2017-12-30 LAB — COMPREHENSIVE METABOLIC PANEL
ALT: 12 U/L — ABNORMAL LOW (ref 14–54)
ANION GAP: 9 (ref 5–15)
AST: 13 U/L — ABNORMAL LOW (ref 15–41)
Albumin: 4.1 g/dL (ref 3.5–5.0)
Alkaline Phosphatase: 73 U/L (ref 38–126)
BILIRUBIN TOTAL: 0.4 mg/dL (ref 0.3–1.2)
BUN: 8 mg/dL (ref 6–20)
CHLORIDE: 103 mmol/L (ref 101–111)
CO2: 24 mmol/L (ref 22–32)
Calcium: 9 mg/dL (ref 8.9–10.3)
Creatinine, Ser: 0.71 mg/dL (ref 0.44–1.00)
GFR calc Af Amer: >60 mL/min (ref >60)
Glucose, Bld: 97 mg/dL (ref 65–99)
POTASSIUM: 4.2 mmol/L (ref 3.5–5.1)
Sodium: 136 mmol/L (ref 135–145)
TOTAL PROTEIN: 7.7 g/dL (ref 6.5–8.1)

## 2017-12-30 LAB — LIPASE, BLOOD: LIPASE: 28 U/L (ref 11–51)

## 2017-12-30 MED ORDER — CIPROFLOXACIN IN D5W 400 MG/200ML IV SOLN
400.0000 mg | Freq: Once | INTRAVENOUS | Status: AC
  Administered 2017-12-30: 400 mg via INTRAVENOUS
  Filled 2017-12-30: qty 200

## 2017-12-30 MED ORDER — ONDANSETRON HCL 4 MG/2ML IJ SOLN
4.0000 mg | Freq: Four times a day (QID) | INTRAMUSCULAR | Status: DC | PRN
  Administered 2017-12-31 (×3): 4 mg via INTRAVENOUS
  Filled 2017-12-30 (×3): qty 2

## 2017-12-30 MED ORDER — METRONIDAZOLE IN NACL 5-0.79 MG/ML-% IV SOLN
500.0000 mg | Freq: Once | INTRAVENOUS | Status: AC
  Administered 2017-12-30: 500 mg via INTRAVENOUS
  Filled 2017-12-30: qty 100

## 2017-12-30 MED ORDER — ONDANSETRON HCL 4 MG PO TABS: 4.0000 mg | ORAL_TABLET | Freq: Four times a day (QID) | ORAL | Status: DC | PRN

## 2017-12-30 MED ORDER — KETOROLAC TROMETHAMINE 30 MG/ML IJ SOLN
30.0000 mg | Freq: Four times a day (QID) | INTRAMUSCULAR | Status: DC | PRN
  Administered 2017-12-31 (×3): 30 mg via INTRAVENOUS
  Filled 2017-12-30 (×5): qty 1

## 2017-12-30 MED ORDER — ACETAMINOPHEN 325 MG PO TABS: 650.0000 mg | ORAL_TABLET | Freq: Four times a day (QID) | ORAL | Status: DC | PRN

## 2017-12-30 MED ORDER — ONDANSETRON HCL 4 MG/2ML IJ SOLN
4.0000 mg | Freq: Once | INTRAMUSCULAR | Status: AC
  Administered 2017-12-30: 4 mg via INTRAVENOUS
  Filled 2017-12-30: qty 2

## 2017-12-30 MED ORDER — HYPROMELLOSE (GONIOSCOPIC) 2.5 % OP SOLN
1.0000 [drp] | Freq: Three times a day (TID) | OPHTHALMIC | Status: DC | PRN
  Filled 2017-12-30: qty 15

## 2017-12-30 MED ORDER — LACTATED RINGERS IV SOLN
INTRAVENOUS | Status: AC
  Administered 2017-12-31: 02:00:00 via INTRAVENOUS

## 2017-12-30 MED ORDER — HYDROMORPHONE HCL 1 MG/ML IJ SOLN
1.0000 mg | Freq: Once | INTRAMUSCULAR | Status: AC
  Administered 2017-12-30: 1 mg via INTRAVENOUS
  Filled 2017-12-30: qty 1

## 2017-12-30 MED ORDER — CIPROFLOXACIN IN D5W 400 MG/200ML IV SOLN: 400.0000 mg | Freq: Once | INTRAVENOUS | Status: DC

## 2017-12-30 MED ORDER — FENTANYL CITRATE (PF) 100 MCG/2ML IJ SOLN
50.0000 ug | INTRAMUSCULAR | Status: DC | PRN
  Administered 2017-12-31 (×2): 50 ug via INTRAVENOUS
  Filled 2017-12-30 (×2): qty 2

## 2017-12-30 MED ORDER — SODIUM CHLORIDE 0.9 % IV BOLUS (SEPSIS)
1000.0000 mL | Freq: Once | INTRAVENOUS | Status: AC
  Administered 2017-12-30: 1000 mL via INTRAVENOUS

## 2017-12-30 MED ORDER — METRONIDAZOLE IN NACL 5-0.79 MG/ML-% IV SOLN
500.0000 mg | Freq: Three times a day (TID) | INTRAVENOUS | Status: DC
  Administered 2017-12-31 (×3): 500 mg via INTRAVENOUS
  Filled 2017-12-30 (×4): qty 100

## 2017-12-30 MED ORDER — ACETAMINOPHEN 650 MG RE SUPP: 650.0000 mg | Freq: Four times a day (QID) | RECTAL | Status: DC | PRN

## 2017-12-30 MED ORDER — IOPAMIDOL (ISOVUE-300) INJECTION 61%
100.0000 mL | Freq: Once | INTRAVENOUS | Status: AC | PRN
  Administered 2017-12-30: 100 mL via INTRAVENOUS

## 2017-12-30 NOTE — ED Triage Notes (Signed)
Pt states lower right abd pain since yesterday. Pt states had similar pain last week but went away. Nausea but denies vomiting and diarrhea

## 2017-12-30 NOTE — H&P (Addendum)
History and Physical    Casey OvenMelanie D Harpham ZOX:[id number] DOB: [birth year] DOA: 12/30/2017  PCP: Sandford Craze'Sullivan, Melissa, NP   Patient coming from: Home  Chief Complaint: RLQ abdominal pain  HPI: Casey Larson is a 37 y.o. female with no significant past medical history who presents to the emergency department tonight with sudden onset right lower quadrant abdominal pain that actually woke her from sleep yesterday evening.  She states that the pain is constant but does intermittently worsen it is associated with some nausea.  No radiation of the pain or associated diarrhea or constipation.  No specific aggravating or alleviating factors noted.  Aside from a prior C-section, she has not had any other abdominal surgeries.   ED Course: Vital signs and laboratory data are unremarkable.  CT of the abdomen and pelvis with contrast demonstrates findings of acute appendicitis with associated reactive lymphadenopathy.  She has received some fentanyl for her pain which is currently causing some nausea.  General surgery, Dr. Lovell SheehanJenkins was called by ED physician who would like for us to admit the patient and will consult in a.m.  Review of Systems: As per HPI otherwise 10 point review of systems negative.   History reviewed. No pertinent past medical history.  Past Surgical History:  Procedure Laterality Date  . CESAREAN SECTION  3x last being 01/12  . EYE SURGERY     lasik     reports that  has never smoked. she has never used smokeless tobacco. She reports that she does not drink alcohol or use drugs.  Allergies  Allergen Reactions  . Codeine     Syncope   . Penicillins Hives    Has patient had a PCN reaction causing immediate rash, facial/tongue/throat swelling, SOB or lightheadedness with hypotension: Yes Has patient had a PCN reaction causing severe rash involving mucus membranes or skin necrosis: No Has patient had a PCN reaction that required hospitalization: No Has patient had a PCN  reaction occurring within the last 10 years: No If all of the above answers are "NO", then may proceed with Cephalosporin use.     Family History  Problem Relation Age of Onset  . Diabetes Father   . Cancer Maternal Grandmother        lung cancer    Prior to Admission medications   Medication Sig Start Date End Date Taking? Authorizing Provider  hydroxypropyl methylcellulose / hypromellose (ISOPTO TEARS / GONIOVISC) 2.5 % ophthalmic solution Place 1 drop into both eyes 3 (three) times daily as needed for dry eyes.   Yes [provider]    Physical Exam: Vitals:   12/30/17 1941 12/30/17 2130 12/30/17 2200 12/30/17 2230  BP: 116/73 115/71 116/70 109/72  Pulse: 76 88 87 97  Resp: 20 16 18 20   Temp:      TempSrc:      SpO2: 100% 100% 94% 100%  Weight:      Height:        Constitutional: NAD, calm, comfortable Vitals:   12/30/17 1941 12/30/17 2130 12/30/17 2200 12/30/17 2230  BP: 116/73 115/71 116/70 109/72  Pulse: 76 88 87 97  Resp: 20 16 18 20   Temp:      TempSrc:      SpO2: 100% 100% 94% 100%  Weight:      Height:       Eyes: lids and conjunctivae normal ENMT: Mucous membranes are moist.  Neck: normal, supple Respiratory: clear to auscultation bilaterally. Normal respiratory effort. No accessory muscle use.  Cardiovascular:  Regular rate and rhythm, no murmurs. No extremity edema. Abdomen:  Tenderness on RLQ, no distention. Bowel sounds positive.  Musculoskeletal:  No joint deformity upper and lower extremities.   Skin: no rashes, lesions, ulcers.  Psychiatric: Normal judgment and insight. Alert and oriented x 3. Normal mood.   Labs on Admission: I have personally reviewed following labs and imaging studies  CBC: Recent Labs  Lab 12/30/17 1433  WBC 9.7  NEUTROABS 6.7  HGB 14.1  HCT 42.1  MCV 91.1  PLT 294   Basic Metabolic Panel: Recent Labs  Lab 12/30/17 1433  NA 136  K 4.2  CL 103  CO2 24  GLUCOSE 97  BUN 8  CREATININE 0.71  CALCIUM  9.0   GFR: Estimated Creatinine Clearance: 84.8 mL/min (by C-G formula based on SCr of 0.71 mg/dL). Liver Function Tests: Recent Labs  Lab 12/30/17 1433  AST 13*  ALT 12*  ALKPHOS 73  BILITOT 0.4  PROT 7.7  ALBUMIN 4.1   Recent Labs  Lab 12/30/17 1433  LIPASE 28   No results for input(s): AMMONIA in the last 168 hours. Coagulation Profile: No results for input(s): INR, PROTIME in the last 168 hours. Cardiac Enzymes: No results for input(s): CKTOTAL, CKMB, CKMBINDEX, TROPONINI in the last 168 hours. BNP (last 3 results) No results for input(s): PROBNP in the last 8760 hours. HbA1C: No results for input(s): HGBA1C in the last 72 hours. CBG: No results for input(s): GLUCAP in the last 168 hours. Lipid Profile: No results for input(s): CHOL, HDL, LDLCALC, TRIG, CHOLHDL, LDLDIRECT in the last 72 hours. Thyroid Function Tests: No results for input(s): TSH, T4TOTAL, FREET4, T3FREE, THYROIDAB in the last 72 hours. Anemia Panel: No results for input(s): VITAMINB12, FOLATE, FERRITIN, TIBC, IRON, RETICCTPCT in the last 72 hours. Urine analysis:    Component Value Date/Time   COLORURINE STRAW (A) 12/30/2017 1406   APPEARANCEUR CLEAR 12/30/2017 1406   LABSPEC 1.008 12/30/2017 1406   PHURINE 6.0 12/30/2017 1406   GLUCOSEU NEGATIVE 12/30/2017 1406   HGBUR SMALL (A) 12/30/2017 1406   BILIRUBINUR NEGATIVE 12/30/2017 1406   KETONESUR NEGATIVE 12/30/2017 1406   PROTEINUR NEGATIVE 12/30/2017 1406   UROBILINOGEN 1.0 11/22/2011 2145   NITRITE NEGATIVE 12/30/2017 1406   LEUKOCYTESUR NEGATIVE 12/30/2017 1406    Radiological Exams on Admission: Ct Abdomen Pelvis W Contrast  Result Date: 12/30/2017 CLINICAL DATA:  Lower right abdominal pain since yesterday. Similar pain last week which went away. Nausea without vomiting. EXAM: CT ABDOMEN AND PELVIS WITH CONTRAST TECHNIQUE: Multidetector CT imaging of the abdomen and pelvis was performed using the standard protocol following bolus  administration of intravenous contrast. CONTRAST:  ISOVUE-300 IOPAMIDOL (ISOVUE-300) INJECTION 61% COMPARISON:  None. FINDINGS: Lower chest: No acute abnormality. Hepatobiliary: No focal liver abnormality is seen. No gallstones, gallbladder wall thickening, or biliary dilatation. Pancreas: Unremarkable. No pancreatic ductal dilatation or surrounding inflammatory changes. Spleen: Normal in size without focal abnormality. Adrenals/Urinary Tract: Adrenal glands are unremarkable. Kidneys are normal, without renal calculi, focal lesion, or hydronephrosis. Bladder is unremarkable. Stomach/Bowel: There is subtle periappendiceal inflammatory change in the right lower quadrant with retrocecal appendix, borderline pathologically distended measuring between 7-8 mm in diameter. Hyperenhancing mucosa noted. 2 mm calcification is seen at the base of the appendix, series 6, image 26 and series 2, image 52. No appendicolith is seen. Adjacent small mesenteric lymph nodes likely reactive are seen in the right lower quadrant. The stomach, small intestine and large bowel are otherwise unremarkable. Vascular/Lymphatic: No significant vascular  findings are present. No enlarged abdominal or pelvic lymph nodes. Reproductive: Uterus and bilateral adnexa are unremarkable. Other: No abdominal wall hernia or abnormality. No abdominopelvic ascites. Musculoskeletal: No acute or significant osseous findings. IMPRESSION: Borderline distended retrocecal appendix up to 7-8 mm in diameter with hyperenhancing mucosa and minimal right lower quadrant mesenteric inflammatory change. There appears to be a 2 mm calcification at the base of the appendix. Reactive mesenteric lymph nodes in the right lower quadrant are also present. Findings are suspicious for early changes of acute appendicitis, coronal series 5, image 34, series 2, images 40-47. Otherwise unremarkable CT of the abdomen and pelvis. Electronically Signed   By: Tollie Eth M.D.   On:  12/30/2017 21:34    Assessment/Plan Principal Problem:   Acute appendicitis    Acute appendicitis N.p.o. IV fluid Zofran prn N/V Continue on Cipro and Flagyl IV Pain management with Toradol (poor reaction to narcotics) Consult patient to general surgery for evaluation in a.m.   DVT prophylaxis: SCDs Code Status: Full Family Communication: Husband at bedside Disposition Plan:Home Consults called:Gen Surgery Admission status: Inpatient, Med-Surg   Erick Blinks DO [facility] Pager [phone]  If 7PM-7AM, please contact night-coverage [email or web address] Password [facility]  12/30/2017, 11:22 PM

## 2017-12-30 NOTE — ED Provider Notes (Signed)
Emergency Department Provider Note   I have reviewed the triage vital signs and the nursing notes.   HISTORY  Chief Complaint Abdominal Pain   HPI Casey Larson is a 37 y.o. female without significant past medical history the presents to the emergency department today with right lower quadrant abdominal pain.  She states that started last evening and seems to be constant but intensifies in waves.  Had nausea with it as well.  Little bit chills but no fevers.  No diarrhea or constipation.  No urinary symptoms.  No radiation.  Has not really moved much since it started.  No rashes.  No history of anything similar to this.  She has not tried anything to help with the discomfort.  No history of cyst.  Her menstrual cycle started a few days ago but she never had any discomfort like this with it in the past.  No known gallbladder issues and has never had her appendix taken. No other associated or modifying symptoms.    History reviewed. No pertinent past medical history.  Patient Active Problem List   Diagnosis Date Noted  . Vasovagal near syncope 03/12/2014  . URI (upper respiratory infection) 10/02/2012  . Left breast mass 10/02/2012    Past Surgical History:  Procedure Laterality Date  . CESAREAN SECTION  3x last being 01/12  . EYE SURGERY     lasik    Current Outpatient Rx  . Order #: [id number] Class: Historical Med    Allergies Codeine and Penicillins  Family History  Problem Relation Age of Onset  . Diabetes Father   . Cancer Maternal Grandmother        lung cancer    Social History Social History   Tobacco Use  . Smoking status: Never Smoker  . Smokeless tobacco: Never Used  Substance Use Topics  . Alcohol use: No  . Drug use: No    Review of Systems  All other systems negative except as documented in the HPI. All pertinent positives and negatives as reviewed in the HPI. ____________________________________________   PHYSICAL EXAM:  VITAL  SIGNS: ED Triage Vitals  Enc Vitals Group     BP 12/30/17 1402 120/78     Pulse Rate 12/30/17 1941 76     Resp 12/30/17 1402 18     Temp 12/30/17 1402 98.4 F (36.9 C)     Temp Source 12/30/17 1402 Oral     SpO2 12/30/17 1402 100 %     Weight 12/30/17 1404 135 lb (61.2 kg)     Height 12/30/17 1404 5\' 4"  (1.626 m)    Constitutional: Alert and oriented. Well appearing and in no acute distress. Eyes: Conjunctivae are normal. PERRL. EOMI. Head: Atraumatic. Nose: No congestion/rhinnorhea. Mouth/Throat: Mucous membranes are moist.  Oropharynx non-erythematous. Neck: No stridor.  No meningeal signs.   Cardiovascular: Normal rate, regular rhythm. Good peripheral circulation. Grossly normal heart sounds.   Respiratory: Normal respiratory effort.  No retractions. Lungs CTAB. Gastrointestinal: Soft and tender in RLQ with minimal ttp in RUQ. No distention.  Musculoskeletal: No lower extremity tenderness nor edema. No gross deformities of extremities. Neurologic:  Normal speech and language. No gross focal neurologic deficits are appreciated.  Skin:  Skin is warm, dry and intact. No rash noted.   ____________________________________________   LABS (all labs ordered are listed, but only abnormal results are displayed)  Labs Reviewed  URINALYSIS, ROUTINE W REFLEX MICROSCOPIC - Abnormal; Notable for the following components:      Result Value  Color, Urine STRAW (*)    Hgb urine dipstick SMALL (*)    Bacteria, UA RARE (*)    Squamous Epithelial / LPF 0-5 (*)    All other components within normal limits  COMPREHENSIVE METABOLIC PANEL - Abnormal; Notable for the following components:   AST 13 (*)    ALT 12 (*)    All other components within normal limits  PREGNANCY, URINE  CBC WITH DIFFERENTIAL/PLATELET  LIPASE, BLOOD   ____________________________________________  RADIOLOGY  Ct Abdomen Pelvis W Contrast  Result Date: 12/30/2017 CLINICAL DATA:  Lower right abdominal pain since  yesterday. Similar pain last week which went away. Nausea without vomiting. EXAM: CT ABDOMEN AND PELVIS WITH CONTRAST TECHNIQUE: Multidetector CT imaging of the abdomen and pelvis was performed using the standard protocol following bolus administration of intravenous contrast. CONTRAST:  100mL ISOVUE-300 IOPAMIDOL (ISOVUE-300) INJECTION 61% COMPARISON:  None. FINDINGS: Lower chest: No acute abnormality. Hepatobiliary: No focal liver abnormality is seen. No gallstones, gallbladder wall thickening, or biliary dilatation. Pancreas: Unremarkable. No pancreatic ductal dilatation or surrounding inflammatory changes. Spleen: Normal in size without focal abnormality. Adrenals/Urinary Tract: Adrenal glands are unremarkable. Kidneys are normal, without renal calculi, focal lesion, or hydronephrosis. Bladder is unremarkable. Stomach/Bowel: There is subtle periappendiceal inflammatory change in the right lower quadrant with retrocecal appendix, borderline pathologically distended measuring between 7-8 mm in diameter. Hyperenhancing mucosa noted. 2 mm calcification is seen at the base of the appendix, series 6, image 26 and series 2, image 52. No appendicolith is seen. Adjacent small mesenteric lymph nodes likely reactive are seen in the right lower quadrant. The stomach, small intestine and large bowel are otherwise unremarkable. Vascular/Lymphatic: No significant vascular findings are present. No enlarged abdominal or pelvic lymph nodes. Reproductive: Uterus and bilateral adnexa are unremarkable. Other: No abdominal wall hernia or abnormality. No abdominopelvic ascites. Musculoskeletal: No acute or significant osseous findings. IMPRESSION: Borderline distended retrocecal appendix up to 7-8 mm in diameter with hyperenhancing mucosa and minimal right lower quadrant mesenteric inflammatory change. There appears to be a 2 mm calcification at the base of the appendix. Reactive mesenteric lymph nodes in the right lower quadrant are  also present. Findings are suspicious for early changes of acute appendicitis, coronal series 5, image 34, series 2, images 40-47. Otherwise unremarkable CT of the abdomen and pelvis. Electronically Signed   By: Tollie Ethavid  Kwon M.D.   On: 12/30/2017 21:34    ____________________________________________   PROCEDURES  Procedure(s) performed:   Procedures   ____________________________________________   INITIAL IMPRESSION / ASSESSMENT AND PLAN / ED COURSE  Exam and history concerning for appendicitis vs less likely kidney stone or colitis. Will ct/anti-emetics/pain meds.   Appy on CT. D/w Dr> Lovell SheehanJenkins, requests hospitalist admission, will see in AM for surgery. Requests NPO after midnight. abx added on.   Pertinent labs & imaging results that were available during my care of the patient were reviewed by me and considered in my medical decision making (see chart for details).  ____________________________________________  FINAL CLINICAL IMPRESSION(S) / ED DIAGNOSES  Final diagnoses:  Other acute appendicitis     MEDICATIONS GIVEN DURING THIS VISIT:  Medications  ciprofloxacin (CIPRO) IVPB 400 mg (400 mg Intravenous New Bag/Given 12/30/17 2216)  metroNIDAZOLE (FLAGYL) IVPB 500 mg (500 mg Intravenous New Bag/Given 12/30/17 2217)  HYDROmorphone (DILAUDID) injection 1 mg (1 mg Intravenous Given 12/30/17 2018)  sodium chloride 0.9 % bolus 1,000 mL (0 mLs Intravenous Stopped 12/30/17 2146)  ondansetron (ZOFRAN) injection 4 mg (4 mg Intravenous Given 12/30/17  2018)  iopamidol (ISOVUE-300) 61 % injection 100 mL (100 mLs Intravenous Contrast Given 12/30/17 2109)     NEW OUTPATIENT MEDICATIONS STARTED DURING THIS VISIT:  This SmartLink is deprecated. Use AVSMEDLIST instead to display the medication list for a patient.  Note:  This note was prepared with assistance of Dragon voice recognition software. Occasional wrong-word or sound-a-like substitutions may have occurred due to the inherent  limitations of voice recognition software.   Marily Memos, MD 12/30/17 2221

## 2017-12-31 ENCOUNTER — Encounter (HOSPITAL_COMMUNITY): Payer: Self-pay | Admitting: *Deleted

## 2017-12-31 ENCOUNTER — Encounter (HOSPITAL_COMMUNITY)
Admission: EM | Disposition: A | Discharge status: Home / Self Care | Payer: Self-pay | Source: Home / Self Care | Attending: General Surgery

## 2017-12-31 ENCOUNTER — Other Ambulatory Visit: Payer: Self-pay

## 2017-12-31 ENCOUNTER — Inpatient Hospital Stay (HOSPITAL_COMMUNITY): Payer: BLUE CROSS/BLUE SHIELD | Admitting: Anesthesiology

## 2017-12-31 DIAGNOSIS — K3580 Unspecified acute appendicitis: Secondary | ICD-10-CM

## 2017-12-31 HISTORY — PX: LAPAROSCOPIC APPENDECTOMY: SHX408

## 2017-12-31 LAB — COMPREHENSIVE METABOLIC PANEL
ALBUMIN: 3.3 g/dL — AB (ref 3.5–5.0)
ALK PHOS: 64 U/L (ref 38–126)
ALT: 11 U/L — AB (ref 14–54)
AST: 12 U/L — AB (ref 15–41)
Anion gap: 7 (ref 5–15)
BILIRUBIN TOTAL: 0.4 mg/dL (ref 0.3–1.2)
BUN: 6 mg/dL (ref 6–20)
CO2: 22 mmol/L (ref 22–32)
CREATININE: 0.54 mg/dL (ref 0.44–1.00)
Calcium: 8.3 mg/dL — ABNORMAL LOW (ref 8.9–10.3)
Chloride: 106 mmol/L (ref 101–111)
GFR calc Af Amer: >60 mL/min (ref >60)
GFR calc non Af Amer: >60 mL/min (ref >60)
GLUCOSE: 110 mg/dL — AB (ref 65–99)
POTASSIUM: 4 mmol/L (ref 3.5–5.1)
Sodium: 135 mmol/L (ref 135–145)
TOTAL PROTEIN: 6.6 g/dL (ref 6.5–8.1)

## 2017-12-31 LAB — CBC
HEMATOCRIT: 37.4 % (ref 36.0–46.0)
Hemoglobin: 12.2 g/dL (ref 12.0–15.0)
MCH: 29.8 pg (ref 26.0–34.0)
MCHC: 32.6 g/dL (ref 30.0–36.0)
MCV: 91.2 fL (ref 78.0–100.0)
PLATELETS: 272 10*3/uL (ref 150–400)
RBC: 4.1 MIL/uL (ref 3.87–5.11)
RDW: 13.3 % (ref 11.5–15.5)
WBC: 9.4 10*3/uL (ref 4.0–10.5)

## 2017-12-31 LAB — SURGICAL PCR SCREEN
MRSA, PCR: NEGATIVE
Staphylococcus aureus: POSITIVE — AB

## 2017-12-31 SURGERY — APPENDECTOMY, LAPAROSCOPIC
Anesthesia: General

## 2017-12-31 MED ORDER — POVIDONE-IODINE 10 % OINT PACKET
TOPICAL_OINTMENT | CUTANEOUS | Status: DC | PRN
  Administered 2017-12-31: 1 via TOPICAL

## 2017-12-31 MED ORDER — LACTATED RINGERS IV SOLN
INTRAVENOUS | Status: DC
  Administered 2017-12-31: 14:00:00 via INTRAVENOUS

## 2017-12-31 MED ORDER — ENOXAPARIN SODIUM 40 MG/0.4ML ~~LOC~~ SOLN: 40.0000 mg | SUBCUTANEOUS | Status: DC

## 2017-12-31 MED ORDER — DIPHENHYDRAMINE HCL 50 MG/ML IJ SOLN: 25.0000 mg | Freq: Four times a day (QID) | INTRAMUSCULAR | Status: DC | PRN

## 2017-12-31 MED ORDER — HEMOSTATIC AGENTS (NO CHARGE) OPTIME
TOPICAL | Status: DC | PRN
  Administered 2017-12-31: 1 via TOPICAL

## 2017-12-31 MED ORDER — MUPIROCIN 2 % EX OINT
TOPICAL_OINTMENT | Freq: Two times a day (BID) | CUTANEOUS | Status: DC
  Administered 2017-12-31: 10:00:00 via NASAL
  Filled 2017-12-31: qty 22

## 2017-12-31 MED ORDER — DIPHENHYDRAMINE HCL 25 MG PO CAPS: 25.0000 mg | ORAL_CAPSULE | Freq: Four times a day (QID) | ORAL | Status: DC | PRN

## 2017-12-31 MED ORDER — CHLORHEXIDINE GLUCONATE CLOTH 2 % EX PADS
6.0000 | MEDICATED_PAD | Freq: Once | CUTANEOUS | Status: AC
  Administered 2017-12-31: 6 via TOPICAL

## 2017-12-31 MED ORDER — FENTANYL CITRATE (PF) 250 MCG/5ML IJ SOLN
INTRAMUSCULAR | Status: AC
Start: 2017-12-31 — End: 2017-12-31
  Filled 2017-12-31: qty 5

## 2017-12-31 MED ORDER — SUGAMMADEX SODIUM 200 MG/2ML IV SOLN
INTRAVENOUS | Status: DC | PRN
  Administered 2017-12-31: 200 mg via INTRAVENOUS

## 2017-12-31 MED ORDER — PROPOFOL 10 MG/ML IV BOLUS
INTRAVENOUS | Status: DC | PRN
  Administered 2017-12-31: 150 mg via INTRAVENOUS
  Administered 2017-12-31: 30 mg via INTRAVENOUS

## 2017-12-31 MED ORDER — LACTATED RINGERS IV SOLN
INTRAVENOUS | Status: DC
  Administered 2017-12-31 (×2): via INTRAVENOUS

## 2017-12-31 MED ORDER — SODIUM CHLORIDE 0.9 % IR SOLN
Status: DC | PRN
  Administered 2017-12-31: 1000 mL

## 2017-12-31 MED ORDER — CHLORHEXIDINE GLUCONATE CLOTH 2 % EX PADS: 6.0000 | MEDICATED_PAD | Freq: Once | CUTANEOUS | Status: DC

## 2017-12-31 MED ORDER — ROCURONIUM BROMIDE 100 MG/10ML IV SOLN
INTRAVENOUS | Status: DC | PRN
  Administered 2017-12-31: 25 mg via INTRAVENOUS
  Administered 2017-12-31: 5 mg via INTRAVENOUS

## 2017-12-31 MED ORDER — SUCCINYLCHOLINE CHLORIDE 20 MG/ML IJ SOLN
INTRAMUSCULAR | Status: AC
  Filled 2017-12-31: qty 1

## 2017-12-31 MED ORDER — SUCCINYLCHOLINE CHLORIDE 20 MG/ML IJ SOLN
INTRAMUSCULAR | Status: DC | PRN
  Administered 2017-12-31: 120 mg via INTRAVENOUS

## 2017-12-31 MED ORDER — SODIUM CHLORIDE 0.9 % IV BOLUS (SEPSIS)
500.0000 mL | Freq: Once | INTRAVENOUS | Status: AC
  Administered 2017-12-31: 500 mL via INTRAVENOUS

## 2017-12-31 MED ORDER — LIDOCAINE HCL (CARDIAC) 10 MG/ML IV SOLN
INTRAVENOUS | Status: DC | PRN
  Administered 2017-12-31: 50 mg via INTRAVENOUS

## 2017-12-31 MED ORDER — SIMETHICONE 80 MG PO CHEW: 40.0000 mg | CHEWABLE_TABLET | Freq: Four times a day (QID) | ORAL | Status: DC | PRN

## 2017-12-31 MED ORDER — FENTANYL CITRATE (PF) 100 MCG/2ML IJ SOLN
INTRAMUSCULAR | Status: DC | PRN
  Administered 2017-12-31 (×2): 100 ug via INTRAVENOUS
  Administered 2017-12-31: 50 ug via INTRAVENOUS

## 2017-12-31 MED ORDER — POVIDONE-IODINE 10 % EX OINT
TOPICAL_OINTMENT | CUTANEOUS | Status: AC
  Filled 2017-12-31: qty 1

## 2017-12-31 MED ORDER — HYDROCODONE-ACETAMINOPHEN 5-325 MG PO TABS: 1.0000 | ORAL_TABLET | ORAL | Status: DC | PRN

## 2017-12-31 MED ORDER — BUPIVACAINE HCL (PF) 0.5 % IJ SOLN
INTRAMUSCULAR | Status: AC
  Filled 2017-12-31: qty 30

## 2017-12-31 MED ORDER — ONDANSETRON HCL 4 MG/2ML IJ SOLN
INTRAMUSCULAR | Status: AC
  Filled 2017-12-31: qty 2

## 2017-12-31 MED ORDER — FENTANYL CITRATE (PF) 100 MCG/2ML IJ SOLN
25.0000 ug | INTRAMUSCULAR | Status: DC | PRN
  Administered 2017-12-31: 25 ug via INTRAVENOUS
  Filled 2017-12-31: qty 2

## 2017-12-31 MED ORDER — MIDAZOLAM HCL 2 MG/2ML IJ SOLN
1.0000 mg | INTRAMUSCULAR | Status: DC
  Administered 2017-12-31: 2 mg via INTRAVENOUS

## 2017-12-31 MED ORDER — MIDAZOLAM HCL 2 MG/2ML IJ SOLN
INTRAMUSCULAR | Status: AC
  Filled 2017-12-31: qty 2

## 2017-12-31 MED ORDER — SUGAMMADEX SODIUM 200 MG/2ML IV SOLN
INTRAVENOUS | Status: AC
Start: 2017-12-31 — End: 2017-12-31
  Filled 2017-12-31: qty 2

## 2017-12-31 MED ORDER — ONDANSETRON HCL 4 MG/2ML IJ SOLN: 4.0000 mg | Freq: Once | INTRAMUSCULAR | Status: DC

## 2017-12-31 MED ORDER — PROMETHAZINE HCL 25 MG/ML IJ SOLN
25.0000 mg | Freq: Four times a day (QID) | INTRAMUSCULAR | Status: DC | PRN
  Administered 2017-12-31: 25 mg via INTRAVENOUS
  Filled 2017-12-31: qty 1

## 2017-12-31 MED ORDER — BUPIVACAINE HCL (PF) 0.5 % IJ SOLN
INTRAMUSCULAR | Status: DC | PRN
  Administered 2017-12-31: 10 mL

## 2017-12-31 SURGICAL SUPPLY — 46 items
APPLICATOR ARISTA FLEXITIP XL (MISCELLANEOUS) ×3 IMPLANT
BAG HAMPER (MISCELLANEOUS) ×3 IMPLANT
BAG RETRIEVAL 10 (BASKET) ×1
BAG RETRIEVAL 10MM (BASKET) ×1
CHLORAPREP W/TINT 26ML (MISCELLANEOUS) ×3 IMPLANT
CLOTH BEACON ORANGE TIMEOUT ST (SAFETY) ×3 IMPLANT
COVER LIGHT HANDLE STERIS (MISCELLANEOUS) ×6 IMPLANT
CUTTER FLEX LINEAR 45M (STAPLE) ×3 IMPLANT
DECANTER SPIKE VIAL GLASS SM (MISCELLANEOUS) ×3 IMPLANT
ELECT REM PT RETURN 9FT ADLT (ELECTROSURGICAL) ×3
ELECTRODE REM PT RTRN 9FT ADLT (ELECTROSURGICAL) ×1 IMPLANT
EVACUATOR SMOKE 8.L (FILTER) ×3 IMPLANT
GLOVE BIOGEL PI IND STRL 7.0 (GLOVE) ×3 IMPLANT
GLOVE BIOGEL PI INDICATOR 7.0 (GLOVE) ×6
GLOVE ECLIPSE 6.5 STRL STRAW (GLOVE) ×3 IMPLANT
GLOVE SURG SS PI 7.5 STRL IVOR (GLOVE) ×3 IMPLANT
GOWN STRL REUS W/ TWL XL LVL3 (GOWN DISPOSABLE) ×1 IMPLANT
GOWN STRL REUS W/TWL LRG LVL3 (GOWN DISPOSABLE) ×3 IMPLANT
GOWN STRL REUS W/TWL XL LVL3 (GOWN DISPOSABLE) ×2
HEMOSTAT ARISTA ABSORB 1G (MISCELLANEOUS) ×3 IMPLANT
INST SET LAPROSCOPIC AP (KITS) ×3 IMPLANT
IV NS IRRIG 3000ML ARTHROMATIC (IV SOLUTION) IMPLANT
KIT ROOM TURNOVER APOR (KITS) ×3 IMPLANT
MANIFOLD NEPTUNE II (INSTRUMENTS) ×3 IMPLANT
NEEDLE INSUFFLATION 14GA 120MM (NEEDLE) ×3 IMPLANT
NS IRRIG 1000ML POUR BTL (IV SOLUTION) ×3 IMPLANT
PACK LAP CHOLE LZT030E (CUSTOM PROCEDURE TRAY) ×3 IMPLANT
PAD ARMBOARD 7.5X6 YLW CONV (MISCELLANEOUS) ×3 IMPLANT
PENCIL HANDSWITCHING (ELECTRODE) ×3 IMPLANT
RELOAD 45 VASCULAR/THIN (ENDOMECHANICALS) ×3 IMPLANT
SET BASIN LINEN APH (SET/KITS/TRAYS/PACK) ×3 IMPLANT
SHEARS HARMONIC ACE PLUS 36CM (ENDOMECHANICALS) ×3 IMPLANT
SPONGE GAUZE 2X2 8PLY STER LF (GAUZE/BANDAGES/DRESSINGS) ×3
SPONGE GAUZE 2X2 8PLY STRL LF (GAUZE/BANDAGES/DRESSINGS) ×6 IMPLANT
STAPLER VISISTAT (STAPLE) ×3 IMPLANT
SUT VICRYL 0 UR6 27IN ABS (SUTURE) ×3 IMPLANT
SYS BAG RETRIEVAL 10MM (BASKET) ×1
SYSTEM BAG RETRIEVAL 10MM (BASKET) ×1 IMPLANT
TAPE CLOTH SURG 4X10 WHT LF (GAUZE/BANDAGES/DRESSINGS) ×3 IMPLANT
TRAY FOLEY CATH SILVER 16FR (SET/KITS/TRAYS/PACK) ×3 IMPLANT
TROCAR ENDO BLADELESS 11MM (ENDOMECHANICALS) ×3 IMPLANT
TROCAR ENDO BLADELESS 12MM (ENDOMECHANICALS) ×3 IMPLANT
TROCAR XCEL NON-BLD 5MMX100MML (ENDOMECHANICALS) ×3 IMPLANT
TUBING INSUFFLATION (TUBING) ×3 IMPLANT
WARMER LAPAROSCOPE (MISCELLANEOUS) ×3 IMPLANT
YANKAUER SUCT 12FT TUBE ARGYLE (SUCTIONS) ×3 IMPLANT

## 2017-12-31 NOTE — Transfer of Care (Signed)
Immediate Anesthesia Transfer of Care Note  Patient: Casey Larson  Procedure(s) Performed: APPENDECTOMY LAPAROSCOPIC (N/A )  Patient Location: PACU  Anesthesia Type:General  Level of Consciousness: awake and patient cooperative  Airway & Oxygen Therapy: Patient Spontanous Breathing and non-rebreather face mask  Post-op Assessment: Report given to RN and Post -op Vital signs reviewed and stable  Post vital signs: Reviewed and stable  Last Vitals:  Vitals:   12/31/17 1130 12/31/17 1135  BP: (!) 112/57   Pulse:    Resp: 12 12  Temp:    SpO2: 100% 100%    Last Pain:  Vitals:   12/31/17 1051  TempSrc: Oral  PainSc: 4          Complications: No apparent complications

## 2017-12-31 NOTE — Anesthesia Postprocedure Evaluation (Signed)
Anesthesia Post Note  Patient: Casey Larson  Procedure(s) Performed: APPENDECTOMY LAPAROSCOPIC (N/A )  Anesthesia Type: General Level of consciousness: awake and alert and patient cooperative Pain management: satisfactory to patient Vital Signs Assessment: post-procedure vital signs reviewed and stable Respiratory status: spontaneous breathing Cardiovascular status: stable Postop Assessment: no apparent nausea or vomiting Anesthetic complications: no     Last Vitals:  Vitals:   12/31/17 1235 12/31/17 1245  BP: 118/72 110/60  Pulse: (!) 110 97  Resp: 11 (!) 21  Temp: 36.6 C   SpO2: 100% 100%    Last Pain:  Vitals:   12/31/17 1051  TempSrc: Oral  PainSc: 4                  Kharson Rasmusson

## 2017-12-31 NOTE — Consult Note (Signed)
Reason for Consult: Acute appendicitis Referring Physician: Dr. Rene Paci is an 37 y.o. female.  HPI: Patient is a 37 year old white female who presented to the emergency room with a 24-hour history of worsening lower abdominal pain.  She stated that it started suddenly in the right lower quadrant and woke her up from her sleep.  CT scan of the abdomen revealed early acute appendicitis.  Patient currently has pain level of 4 out of 10.  She was admitted by the hospitalist for further evaluation and treatment.  She denies any constipation or diarrhea.  She does have some nausea.  History reviewed. No pertinent past medical history.  Past Surgical History:  Procedure Laterality Date  . CESAREAN SECTION  3x last being 01/12  . EYE SURGERY     lasik    Family History  Problem Relation Age of Onset  . Diabetes Father   . Cancer Maternal Grandmother        lung cancer    Social History:  reports that  has never smoked. she has never used smokeless tobacco. She reports that she does not drink alcohol or use drugs.  Allergies:  Allergies  Allergen Reactions  . Codeine     Syncope   . Penicillins Hives    Has patient had a PCN reaction causing immediate rash, facial/tongue/throat swelling, SOB or lightheadedness with hypotension: Yes Has patient had a PCN reaction causing severe rash involving mucus membranes or skin necrosis: No Has patient had a PCN reaction that required hospitalization: No Has patient had a PCN reaction occurring within the last 10 years: No If all of the above answers are "NO", then may proceed with Cephalosporin use.     Medications: I have reviewed the patient's current medications.  Results for orders placed or performed during the hospital encounter of 12/30/17 (from the past 48 hour(s))  Urinalysis, Routine w reflex microscopic     Status: Abnormal   Collection Time: 12/30/17  2:06 PM  Result Value Ref Range   Color, Urine STRAW (A) YELLOW    APPearance CLEAR CLEAR   Specific Gravity, Urine 1.008 1.005 - 1.030   pH 6.0 5.0 - 8.0   Glucose, UA NEGATIVE NEGATIVE mg/dL   Hgb urine dipstick SMALL (A) NEGATIVE   Bilirubin Urine NEGATIVE NEGATIVE   Ketones, ur NEGATIVE NEGATIVE mg/dL   Protein, ur NEGATIVE NEGATIVE mg/dL   Nitrite NEGATIVE NEGATIVE   Leukocytes, UA NEGATIVE NEGATIVE   RBC / HPF 0-5 0 - 5 RBC/hpf   WBC, UA 0-5 0 - 5 WBC/hpf   Bacteria, UA RARE (A) NONE SEEN   Squamous Epithelial / LPF 0-5 (A) NONE SEEN   Hyaline Casts, UA PRESENT   Pregnancy, urine     Status: None   Collection Time: 12/30/17  2:06 PM  Result Value Ref Range   Preg Test, Ur NEGATIVE NEGATIVE    Comment:        THE SENSITIVITY OF THIS METHODOLOGY IS >20 mIU/mL.   CBC with Differential     Status: None   Collection Time: 12/30/17  2:33 PM  Result Value Ref Range   WBC 9.7 4.0 - 10.5 K/uL   RBC 4.62 3.87 - 5.11 MIL/uL   Hemoglobin 14.1 12.0 - 15.0 g/dL   HCT 42.1 36.0 - 46.0 %   MCV 91.1 78.0 - 100.0 fL   MCH 30.5 26.0 - 34.0 pg   MCHC 33.5 30.0 - 36.0 g/dL   RDW 13.4 11.5 -  15.5 %   Platelets 294 150 - 400 K/uL   Neutrophils Relative % 68 %   Neutro Abs 6.7 1.7 - 7.7 K/uL   Lymphocytes Relative 26 %   Lymphs Abs 2.5 0.7 - 4.0 K/uL   Monocytes Relative 5 %   Monocytes Absolute 0.5 0.1 - 1.0 K/uL   Eosinophils Relative 1 %   Eosinophils Absolute 0.1 0.0 - 0.7 K/uL   Basophils Relative 0 %   Basophils Absolute 0.0 0.0 - 0.1 K/uL  Comprehensive metabolic panel     Status: Abnormal   Collection Time: 12/30/17  2:33 PM  Result Value Ref Range   Sodium 136 135 - 145 mmol/L   Potassium 4.2 3.5 - 5.1 mmol/L   Chloride 103 101 - 111 mmol/L   CO2 24 22 - 32 mmol/L   Glucose, Bld 97 65 - 99 mg/dL   BUN 8 6 - 20 mg/dL   Creatinine, Ser 0.71 0.44 - 1.00 mg/dL   Calcium 9.0 8.9 - 10.3 mg/dL   Total Protein 7.7 6.5 - 8.1 g/dL   Albumin 4.1 3.5 - 5.0 g/dL   AST 13 (L) 15 - 41 U/L   ALT 12 (L) 14 - 54 U/L   Alkaline Phosphatase 73 38 -  126 U/L   Total Bilirubin 0.4 0.3 - 1.2 mg/dL   GFR calc non Af Amer >60 >60 mL/min   GFR calc Af Amer >60 >60 mL/min    Comment: (NOTE) The eGFR has been calculated using the CKD EPI equation. This calculation has not been validated in all clinical situations. eGFR's persistently <60 mL/min signify possible Chronic Kidney Disease.    Anion gap 9 5 - 15  Lipase, blood     Status: None   Collection Time: 12/30/17  2:33 PM  Result Value Ref Range   Lipase 28 11 - 51 U/L  Comprehensive metabolic panel     Status: Abnormal   Collection Time: 12/31/17  4:16 AM  Result Value Ref Range   Sodium 135 135 - 145 mmol/L   Potassium 4.0 3.5 - 5.1 mmol/L   Chloride 106 101 - 111 mmol/L   CO2 22 22 - 32 mmol/L   Glucose, Bld 110 (H) 65 - 99 mg/dL   BUN 6 6 - 20 mg/dL   Creatinine, Ser 0.54 0.44 - 1.00 mg/dL   Calcium 8.3 (L) 8.9 - 10.3 mg/dL   Total Protein 6.6 6.5 - 8.1 g/dL   Albumin 3.3 (L) 3.5 - 5.0 g/dL   AST 12 (L) 15 - 41 U/L   ALT 11 (L) 14 - 54 U/L   Alkaline Phosphatase 64 38 - 126 U/L   Total Bilirubin 0.4 0.3 - 1.2 mg/dL   GFR calc non Af Amer >60 >60 mL/min   GFR calc Af Amer >60 >60 mL/min    Comment: (NOTE) The eGFR has been calculated using the CKD EPI equation. This calculation has not been validated in all clinical situations. eGFR's persistently <60 mL/min signify possible Chronic Kidney Disease.    Anion gap 7 5 - 15  CBC     Status: None   Collection Time: 12/31/17  4:16 AM  Result Value Ref Range   WBC 9.4 4.0 - 10.5 K/uL   RBC 4.10 3.87 - 5.11 MIL/uL   Hemoglobin 12.2 12.0 - 15.0 g/dL   HCT 37.4 36.0 - 46.0 %   MCV 91.2 78.0 - 100.0 fL   MCH 29.8 26.0 - 34.0 pg   MCHC  32.6 30.0 - 36.0 g/dL   RDW 13.3 11.5 - 15.5 %   Platelets 272 150 - 400 K/uL    Ct Abdomen Pelvis W Contrast  Result Date: 12/30/2017 CLINICAL DATA:  Lower right abdominal pain since yesterday. Similar pain last week which went away. Nausea without vomiting. EXAM: CT ABDOMEN AND PELVIS  WITH CONTRAST TECHNIQUE: Multidetector CT imaging of the abdomen and pelvis was performed using the standard protocol following bolus administration of intravenous contrast. CONTRAST:  166m ISOVUE-300 IOPAMIDOL (ISOVUE-300) INJECTION 61% COMPARISON:  None. FINDINGS: Lower chest: No acute abnormality. Hepatobiliary: No focal liver abnormality is seen. No gallstones, gallbladder wall thickening, or biliary dilatation. Pancreas: Unremarkable. No pancreatic ductal dilatation or surrounding inflammatory changes. Spleen: Normal in size without focal abnormality. Adrenals/Urinary Tract: Adrenal glands are unremarkable. Kidneys are normal, without renal calculi, focal lesion, or hydronephrosis. Bladder is unremarkable. Stomach/Bowel: There is subtle periappendiceal inflammatory change in the right lower quadrant with retrocecal appendix, borderline pathologically distended measuring between 7-8 mm in diameter. Hyperenhancing mucosa noted. 2 mm calcification is seen at the base of the appendix, series 6, image 26 and series 2, image 52. No appendicolith is seen. Adjacent small mesenteric lymph nodes likely reactive are seen in the right lower quadrant. The stomach, small intestine and large bowel are otherwise unremarkable. Vascular/Lymphatic: No significant vascular findings are present. No enlarged abdominal or pelvic lymph nodes. Reproductive: Uterus and bilateral adnexa are unremarkable. Other: No abdominal wall hernia or abnormality. No abdominopelvic ascites. Musculoskeletal: No acute or significant osseous findings. IMPRESSION: Borderline distended retrocecal appendix up to 7-8 mm in diameter with hyperenhancing mucosa and minimal right lower quadrant mesenteric inflammatory change. There appears to be a 2 mm calcification at the base of the appendix. Reactive mesenteric lymph nodes in the right lower quadrant are also present. Findings are suspicious for early changes of acute appendicitis, coronal series 5, image  34, series 2, images 40-47. Otherwise unremarkable CT of the abdomen and pelvis. Electronically Signed   By: DAshley RoyaltyM.D.   On: 12/30/2017 21:34    ROS:  Pertinent items are noted in HPI.  Blood pressure 112/62, pulse 79, temperature 98.5 F (36.9 C), temperature source Oral, resp. rate 20, height '5\' 4"'  (1.626 m), weight 137 lb 1.6 oz (62.2 kg), last menstrual period 12/26/2017, SpO2 100 %. Physical Exam: Pleasant well-developed well-nourished white female no acute distress Head is normocephalic, atraumatic Lungs clear to auscultation with equal breath sounds bilaterally Heart examination reveals regular rate and rhythm without S3, S4, murmurs Abdomen is soft and flat.  Tenderness is noted in the right lower quadrant to palpation.  No rigidity is noted.  Bowel sounds are decreased.  CT scan images personally reviewed  Assessment/Plan: Impression: Acute appendicitis Plan: Patient will undergo laparoscopic appendectomy today.  The risks and benefits of the procedure including bleeding, infection, and the possibility of an open procedure were fully explained to the patient, who gave informed consent.  MAviva Signs1/08/2018, 7:18 AM

## 2017-12-31 NOTE — Anesthesia Procedure Notes (Signed)
Procedure Name: Intubation Date/Time: 12/31/2017 11:49 AM Performed by: Vista Deck, CRNA Pre-anesthesia Checklist: Patient identified, Patient being monitored, Timeout performed, Emergency Drugs available and Suction available Patient Re-evaluated:Patient Re-evaluated prior to induction Oxygen Delivery Method: Circle System Utilized Preoxygenation: Pre-oxygenation with 100% oxygen Induction Type: IV induction, Rapid sequence and Cricoid Pressure applied Laryngoscope Size: Mac and 3 Grade View: Grade I Tube type: Oral Tube size: 7.0 mm Number of attempts: 1 Airway Equipment and Method: stylet and Oral airway Placement Confirmation: ETT inserted through vocal cords under direct vision,  positive ETCO2 and breath sounds checked- equal and bilateral Secured at: 21 cm Tube secured with: Tape Dental Injury: Teeth and Oropharynx as per pre-operative assessment

## 2017-12-31 NOTE — Anesthesia Preprocedure Evaluation (Addendum)
Anesthesia Evaluation  Patient identified by MRN, date of birth, ID band Patient awake    Reviewed: Allergy & Precautions, NPO status , Patient's Chart, lab work & pertinent test results  Airway Mallampati: I  TM Distance: >3 FB     Dental  (+) Teeth Intact   Pulmonary neg pulmonary ROS,  URI has resolved   breath sounds clear to auscultation       Cardiovascular  Rhythm:Regular Rate:Tachycardia  Hx vaso vagal reaction with near syncope in past.   Neuro/Psych negative neurological ROS  negative psych ROS   GI/Hepatic neg GERD  ,  Endo/Other  negative endocrine ROS  Renal/GU negative Renal ROS     Musculoskeletal   Abdominal   Peds  Hematology negative hematology ROS (+)   Anesthesia Other Findings   Reproductive/Obstetrics                            Anesthesia Physical Anesthesia Plan  ASA: II and emergent  Anesthesia Plan: General   Post-op Pain Management:    Induction: Intravenous, Rapid sequence and Cricoid pressure planned  PONV Risk Score and Plan:   Airway Management Planned: Oral ETT  Additional Equipment:   Intra-op Plan:   Post-operative Plan: Extubation in OR  Informed Consent: I have reviewed the patients History and Physical, chart, labs and discussed the procedure including the risks, benefits and alternatives for the proposed anesthesia with the patient or authorized representative who has indicated his/her understanding and acceptance.     Plan Discussed with: CRNA  Anesthesia Plan Comments: (IV hydration in pre-op.)       Anesthesia Quick Evaluation

## 2017-12-31 NOTE — Op Note (Signed)
Patient:  Casey Larson  DOB:  16-Feb-1981  MRN:  [id number]   Preop Diagnosis: Acute appendicitis  Postop Diagnosis: Same  Procedure: Laparoscopic appendectomy  Surgeon: Franky MachoMark Gatlin Kittell, MD  Anes: General endotracheal  Indications: Patient is a 37 year old white female who presents the emergency room with right lower quadrant abdominal pain.  CT scan of the abdomen revealed acute appendicitis.  The patient now comes to the operating room for laparoscopic appendectomy.  The risks and benefits of the procedure including bleeding, infection, and the possibility of an open procedure were fully explained to the patient, who gave informed consent.  Procedure note: The patient was placed in supine position.  After induction of general endotracheal anesthesia, the abdomen was prepped and draped using the usual sterile technique with DuraPrep.  Surgical site confirmation was performed.  The supraumbilical incision was made down to the fascia.  A Veress needle was introduced into the abdominal cavity and confirmation of placement was done using saline drop test.  The abdomen was then insufflated to 16 mmHg pressure.  An 11 mm trocar was introduced into the abdominal cavity under direct visualization without difficulty.  The patient was placed in deeper Trendelenburg position and an additional 12 mm trocar was placed in the suprapubic region and a 5 mm trocar was placed left lower quadrant region.  The appendix was easily visualized.  It was somewhat retrocecal in nature.  The mesoappendix was divided using the harmonic scalpel.  A vascular Endo GIA was placed across the base the appendix at its juncture to the cecum and fired.  The appendix was then removed using an Endo Catch bag without difficulty.  The staple line was inspected and noted to be within normal limits.  Arista was placed in this region for hemostasis.  All fluid and air were then evacuated from the abdominal cavity prior to removal of the  trochars.  All wounds were irrigated with normal saline.  All wounds were injected with 0.5% Sensorcaine.  The supraumbilical fascia as well as suprapubic fascia were reapproximated using 0 Vicryl interrupted sutures.  All skin incisions were closed using staples.  Betadine ointment and dry sterile dressings were applied.  All tape needle counts were correct at the end of the procedure.  The patient was extubated in the operating room and transferred to PACU in stable condition.  Complications: None  EBL: Minimal  Specimen: Appendix

## 2017-12-31 NOTE — Progress Notes (Signed)
CRITICAL VALUE ALERT  Critical Value: Staphylococcus aureus positive nare swab  Date & Time Notied:  12/31/17  Provider Notified: Lovell SheehanJenkins   Orders Received/Actions taken: started bactroban cream per standing orders.

## 2018-01-01 ENCOUNTER — Encounter (HOSPITAL_COMMUNITY): Payer: Self-pay | Admitting: General Surgery

## 2018-01-01 LAB — HIV ANTIBODY (ROUTINE TESTING W REFLEX): HIV Screen 4th Generation wRfx: NONREACTIVE

## 2018-01-01 LAB — BASIC METABOLIC PANEL
Anion gap: 5 (ref 5–15)
BUN: 7 mg/dL (ref 6–20)
CALCIUM: 8.2 mg/dL — AB (ref 8.9–10.3)
CO2: 24 mmol/L (ref 22–32)
CREATININE: 0.61 mg/dL (ref 0.44–1.00)
Chloride: 109 mmol/L (ref 101–111)
Glucose, Bld: 77 mg/dL (ref 65–99)
Potassium: 3.8 mmol/L (ref 3.5–5.1)
Sodium: 138 mmol/L (ref 135–145)

## 2018-01-01 LAB — CBC
HCT: 34.4 % — ABNORMAL LOW (ref 36.0–46.0)
Hemoglobin: 11.2 g/dL — ABNORMAL LOW (ref 12.0–15.0)
MCH: 30.1 pg (ref 26.0–34.0)
MCHC: 32.6 g/dL (ref 30.0–36.0)
MCV: 92.5 fL (ref 78.0–100.0)
PLATELETS: 233 10*3/uL (ref 150–400)
RBC: 3.72 MIL/uL — AB (ref 3.87–5.11)
RDW: 13.4 % (ref 11.5–15.5)
WBC: 6.9 10*3/uL (ref 4.0–10.5)

## 2018-01-01 MED ORDER — PROMETHAZINE HCL 50 MG PO TABS: 25.0000 mg | ORAL_TABLET | Freq: Four times a day (QID) | ORAL | 0 refills | Status: DC | PRN

## 2018-01-01 MED ORDER — HYDROCODONE-ACETAMINOPHEN 5-325 MG PO TABS: 1.0000 | ORAL_TABLET | ORAL | 0 refills | Status: DC | PRN

## 2018-01-01 NOTE — Progress Notes (Signed)
Patient is to be discharged home and in stable condition. IV removed, WNL. Patient given discharge instructions and verbalized understanding. Patient will be escorted out by staff via wheelchair when ready.  Quita SkyeMorgan P Dishmon, RN

## 2018-01-01 NOTE — Discharge Instructions (Signed)
Laparoscopic Appendectomy, Adult, Care After °Refer to this sheet in the next few weeks. These instructions provide you with information about caring for yourself after your procedure. Your health care provider may also give you more specific instructions. Your treatment has been planned according to current medical practices, but problems sometimes occur. Call your health care provider if you have any problems or questions after your procedure. °What can I expect after the procedure? °After the procedure, it is common to have: °· A decrease in your energy level. °· Mild pain in the area where the surgical cuts (incisions) were made. °· Constipation. This can be caused by pain medicine and a decrease in your activity. ° °Follow these instructions at home: °Medicines °· Take over-the-counter and prescription medicines only as told by your health care provider. °· Do not drive for 24 hours if you received a sedative. °· Do not drive or operate heavy machinery while taking prescription pain medicine. °· If you were prescribed an antibiotic medicine, take it as told by your health care provider. Do not stop taking the antibiotic even if you start to feel better. °Activity °· For 3 weeks or as long as told by your health care provider: °? Do not lift anything that is heavier than 10 pounds (4.5 kg). °? Do not play contact sports. °· Gradually return to your normal activities. Ask your health care provider what activities are safe for you. °Bathing °· Keep your incisions clean and dry. Clean them as often as told by your health care provider: °? Gently wash the incisions with soap and water. °? Rinse the incisions with water to remove all soap. °? Pat the incisions dry with a clean towel. Do not rub the incisions. °· You may take showers after 48 hours. °· Do not take baths, swim, or use hot tubs for 2 weeks or as told by your health care provider. °Incision care °· Follow instructions from your healthcare provider about  how to take care of your incisions. Make sure you: °? Wash your hands with soap and water before you change your bandage (dressing). If soap and water are not available, use hand sanitizer. °? Change your dressing as told by your health care provider. °? Leave stitches (sutures), skin glue, or adhesive strips in place. These skin closures may need to stay in place for 2 weeks or longer. If adhesive strip edges start to loosen and curl up, you may trim the loose edges. Do not remove adhesive strips completely unless your health care provider tells you to do that. °· Check your incision areas every day for signs of infection. Check for: °? More redness, swelling, or pain. °? More fluid or blood. °? Warmth. °? Pus or a bad smell. °Other Instructions °· If you were sent home with a drain, follow instructions from your health care provider about how to care for the drain and how to empty it. °· Take deep breaths. This helps to prevent your lungs from becoming inflamed. °· To relieve and prevent constipation: °? Drink plenty of fluids. °? Eat plenty of fruits and vegetables. °· Keep all follow-up visits as told by your health care provider. This is important. °Contact a health care provider if: °· You have more redness, swelling, or pain around an incision. °· You have more fluid or blood coming from an incision. °· Your incision feels warm to the touch. °· You have pus or a bad smell coming from an incision or dressing. °· Your incision   edges break open after your sutures have been removed. °· You have increasing pain in your shoulders. °· You feel dizzy or you faint. °· You develop shortness of breath. °· You keep feeling nauseous or vomiting. °· You have diarrhea or you cannot control your bowel functions. °· You lose your appetite. °· You develop swelling or pain in your legs. °Get help right away if: °· You have a fever. °· You develop a rash. °· You have difficulty breathing. °· You have sharp pains in your  chest. °This information is not intended to replace advice given to you by your health care provider. Make sure you discuss any questions you have with your health care provider. °Document Released: 12/09/2005 Document Revised: 05/10/2016 Document Reviewed: 05/29/2015 °Elsevier Interactive Patient Education © 2018 [employer] ° °

## 2018-01-01 NOTE — Discharge Summary (Signed)
Physician Discharge Summary  Patient ID: Iline OvenMelanie D Dauenhauer MRN: [id number] DOB/AGE: 08/11/1981 37 y.o.  Admit date: 12/30/2017 Discharge date: 01/01/2018  Admission Diagnoses: Acute appendicitis  Discharge Diagnoses: Same Principal Problem:   Acute appendicitis Active Problems:   Appendicitis   Discharged Condition: good  Hospital Course: Patient is a 37 year old white female who presented to the emergency room with right lower quadrant abdominal pain.  CT scan of the abdomen revealed acute appendicitis.  She underwent laparoscopic appendectomy on 12/31/2017.  She tolerated the procedure well.  Her postoperative course was remarkable for some nausea which ultimately resolved.  Her diet was advanced without difficulty.  She is being discharged home on 01/01/2018 in good and improving condition.  Treatments: surgery: Laparoscopic appendectomy on 12/31/2017  Discharge Exam: Blood pressure 95/75, pulse (!) 101, temperature 98.1 F (36.7 C), temperature source Oral, resp. rate 14, height 5\' 4"  (1.626 m), weight 137 lb 1.6 oz (62.2 kg), last menstrual period 12/26/2017, SpO2 100 %. General appearance: alert, cooperative and no distress Resp: clear to auscultation bilaterally Cardio: regular rate and rhythm, S1, S2 normal, no murmur, click, rub or gallop GI: Soft, incisions healing well.  Disposition: 01-Home or Self Care  Discharge Instructions    Diet general   Complete by:  As directed    Increase activity slowly   Complete by:  As directed      Allergies as of 01/01/2018      Reactions   Codeine    Syncope   Penicillins Hives   Has patient had a PCN reaction causing immediate rash, facial/tongue/throat swelling, SOB or lightheadedness with hypotension: Yes Has patient had a PCN reaction causing severe rash involving mucus membranes or skin necrosis: No Has patient had a PCN reaction that required hospitalization: No Has patient had a PCN reaction occurring within the last 10  years: No If all of the above answers are "NO", then may proceed with Cephalosporin use.      Medication List    TAKE these medications   HYDROcodone-acetaminophen 5-325 MG tablet Commonly known as:  NORCO/VICODIN Take 1 tablet by mouth every 4 (four) hours as needed for moderate pain.   hydroxypropyl methylcellulose / hypromellose 2.5 % ophthalmic solution Commonly known as:  ISOPTO TEARS / GONIOVISC Place 1 drop into both eyes 3 (three) times daily as needed for dry eyes.   promethazine 50 MG tablet Commonly known as:  PHENERGAN Take 0.5 tablets (25 mg total) by mouth every 6 (six) hours as needed for nausea or vomiting.      Follow-up Information    Franky MachoJenkins, Jahni Paul, MD. Schedule an appointment as soon as possible for a visit on 01/06/2018.   Specialty:  General Surgery Contact information: [address] [place] [phone]           Signed: Franky MachoMark Rithy Mandley 01/01/2018, 8:39 AM

## 2018-01-02 ENCOUNTER — Telehealth: Payer: Self-pay

## 2018-01-02 NOTE — Telephone Encounter (Signed)
01/02/18  [facility] Hospital follow up  Called patient at both listed numbers. Left message for return call.

## 2018-01-05 ENCOUNTER — Telehealth: Payer: Self-pay

## 2018-01-05 NOTE — Telephone Encounter (Signed)
[facility]. Left messages on 01/02/18 and 01/05/18 for return call.

## 2018-01-06 ENCOUNTER — Encounter: Payer: Self-pay | Admitting: General Surgery

## 2018-01-06 ENCOUNTER — Ambulatory Visit (INDEPENDENT_AMBULATORY_CARE_PROVIDER_SITE_OTHER): Payer: Self-pay | Admitting: General Surgery

## 2018-01-06 VITALS — BP 111/74 | HR 83 | Temp 98.4°F | Ht 65.0 in | Wt 140.0 lb

## 2018-01-06 DIAGNOSIS — Z09 Encounter for follow-up examination after completed treatment for conditions other than malignant neoplasm: Secondary | ICD-10-CM

## 2018-01-06 NOTE — Progress Notes (Signed)
Subjective:     Casey Larson  Status post laparoscopic appendectomy.  Doing well.  Has minimal incisional pain.  Nausea has resolved.  Denies any fevers. Objective:    BP 111/74   Pulse 83   Temp 98.4 F (36.9 C)   Ht 5\' 5"  (1.651 m)   Wt 140 lb (63.5 kg)   LMP 12/26/2017   BMI 23.30 kg/m   General:  alert, cooperative and no distress  Abdomen soft, incisions healing well.  Staples removed, Steri-Strips applied. Final pathology consistent with diagnosis.     Assessment:    Doing well postoperatively.    Plan:   Follow-up here as needed.  May resume normal activity.

## 2018-01-20 DIAGNOSIS — J069 Acute upper respiratory infection, unspecified: Secondary | ICD-10-CM | POA: Diagnosis not present

## 2018-01-20 DIAGNOSIS — J209 Acute bronchitis, unspecified: Secondary | ICD-10-CM | POA: Diagnosis not present

## 2018-08-13 DIAGNOSIS — Z6822 Body mass index (BMI) 22.0-22.9, adult: Secondary | ICD-10-CM | POA: Diagnosis not present

## 2018-08-13 DIAGNOSIS — N644 Mastodynia: Secondary | ICD-10-CM | POA: Diagnosis not present

## 2018-12-28 ENCOUNTER — Encounter (HOSPITAL_COMMUNITY): Payer: Self-pay | Admitting: Emergency Medicine

## 2018-12-28 ENCOUNTER — Ambulatory Visit (HOSPITAL_COMMUNITY)
Admission: EM | Admit: 2018-12-28 | Discharge: 2018-12-28 | Disposition: A | Discharge status: Home / Self Care | Payer: BLUE CROSS/BLUE SHIELD | Attending: Internal Medicine | Admitting: Internal Medicine

## 2018-12-28 DIAGNOSIS — R350 Frequency of micturition: Secondary | ICD-10-CM | POA: Diagnosis not present

## 2018-12-28 DIAGNOSIS — R1030 Lower abdominal pain, unspecified: Secondary | ICD-10-CM | POA: Insufficient documentation

## 2018-12-28 LAB — POCT URINALYSIS DIP (DEVICE)
Bilirubin Urine: NEGATIVE
Glucose, UA: NEGATIVE mg/dL
Ketones, ur: NEGATIVE mg/dL
Leukocytes, UA: NEGATIVE
NITRITE: NEGATIVE
PROTEIN: NEGATIVE mg/dL
Specific Gravity, Urine: 1.01 (ref 1.005–1.030)
Urobilinogen, UA: 0.2 mg/dL (ref 0.0–1.0)
pH: 7 (ref 5.0–8.0)

## 2018-12-28 LAB — POCT PREGNANCY, URINE: Preg Test, Ur: NEGATIVE

## 2018-12-28 MED ORDER — NITROFURANTOIN MONOHYD MACRO 100 MG PO CAPS: 100.0000 mg | ORAL_CAPSULE | Freq: Two times a day (BID) | ORAL | 0 refills | Status: DC

## 2018-12-28 NOTE — ED Triage Notes (Signed)
Pt here for mid abd pain last night with some nausea today

## 2018-12-28 NOTE — Discharge Instructions (Addendum)
Exact source of abdominal discomfort is not clear, but there are some urinary symptoms (frequency) and you were tender in the suprapubic area of the low abdomen.  A urine culture is pending.  The urgent care will contact you if a change in treatment is needed based on culture result.  Prescription for macrobid (nitrofurantoin, antibiotic) was sent to the pharmacy pending urine culture results.  Other possible causes of lower abdominal discomfort could include a GI bug, GI irritation/colitis, kidney stone, pelvic infection.  Recheck or followup with your primary care provider if not starting to improve in a few days.  Go to the ED for new fever >100.5 and/or severe/sustained (>1 hour) abdominal pain.

## 2018-12-28 NOTE — ED Provider Notes (Signed)
[facility]    CSN: [id number] Arrival date & time: 12/28/18  1015     History   Chief Complaint Chief Complaint  Patient presents with  . Abdominal Pain    HPI Casey Larson is a 38 y.o. female.   She presents today with some low back discomfort in the last week, little bit of uneasiness in her stomach.  She had the onset of a brief intense central abdominal pain last night, lasted for several seconds, but really still does not feel well today.  No dysuria but has had frequency, marked increase, started last night.  No emesis but does feel nauseous.  Has not been able to eat today.  Had a normal bowel movement yesterday, usually goes every day or every other day.  No unusual vaginal discharge or bleeding. Tried some Tums, did not really help. No fever.  No cough, no runny/congested nose, no sore throat. A little bit worried about the possibility of heart disease.  She has no heart disease in the family, although her father has diabetes.  She does not smoke.  She has no personal history of diabetes/hyperlipidemia/hypertension.  No exertional symptoms. Denies chronic medical conditions that would require regular medical care, does not take medications regularly.    HPI  History reviewed. No pertinent past medical history.  Patient Active Problem List   Diagnosis Date Noted  . Acute appendicitis 12/30/2017  . Appendicitis 12/30/2017  . Vasovagal near syncope 03/12/2014  . URI (upper respiratory infection) 10/02/2012  . Left breast mass 10/02/2012    Past Surgical History:  Procedure Laterality Date  . CESAREAN SECTION  3x last being 01/12  . EYE SURGERY     lasik  . LAPAROSCOPIC APPENDECTOMY N/A 12/31/2017   Procedure: APPENDECTOMY LAPAROSCOPIC;  Surgeon: Franky Macho, MD;  Location: [facility];  Service: General;  Laterality: N/A;      Home Medications    Prior to Admission medications   Medication Sig Start Date End Date Taking? Authorizing Provider    hydroxypropyl methylcellulose / hypromellose (ISOPTO TEARS / GONIOVISC) 2.5 % ophthalmic solution Place 1 drop into both eyes 3 (three) times daily as needed for dry eyes.    [provider]  nitrofurantoin, macrocrystal-monohydrate, (MACROBID) 100 MG capsule Take 1 capsule (100 mg total) by mouth 2 (two) times daily. 12/28/18   Isa Rankin, MD    Family History Family History  Problem Relation Age of Onset  . Diabetes Father   . Cancer Maternal Grandmother        lung cancer    Social History Social History   Tobacco Use  . Smoking status: Never Smoker  . Smokeless tobacco: Never Used  Substance Use Topics  . Alcohol use: No  . Drug use: No     Allergies   Codeine and Penicillins   Review of Systems Review of Systems  All other systems reviewed and are negative.    Physical Exam Triage Vital Signs ED Triage Vitals [12/28/18 1110]  Enc Vitals Group     BP 109/68     Pulse Rate 83     Resp 18     Temp 98.6 F (37 C)     Temp Source Temporal     SpO2 100 %     Weight      Height      Pain Score 3     Pain Loc    Updated Vital Signs BP 109/68 (BP Location: Right Arm)  Pulse 83   Temp 98.6 F (37 C) (Temporal)   Resp 18   SpO2 100%  Physical Exam Vitals signs and nursing note reviewed.  Constitutional:      General: She is not in acute distress.    Comments: Alert, nicely groomed  HENT:     Head: Atraumatic.     Comments: Bilateral TMs moderately dull, no erythema Normal nasal congestion bilaterally Posterior pharynx is slightly injected with clear postnasal drainage Eyes:     Comments: Conjugate gaze, no eye redness/drainage  Neck:     Musculoskeletal: Neck supple.  Cardiovascular:     Rate and Rhythm: Normal rate and regular rhythm.  Pulmonary:     Effort: No respiratory distress.     Breath sounds: No wheezing or rales.     Comments: Lungs clear, symmetric breath sounds  Abdominal:     General: There is no distension.      Palpations: Abdomen is soft.     Tenderness: There is no guarding or rebound.     Comments: Moderately severe suprapubic tenderness to deep palpation, no peritoneal signs No CVAT  Musculoskeletal: Normal range of motion.     Comments: No leg swelling  Skin:    General: Skin is warm and dry.     Comments: No cyanosis  Neurological:     Mental Status: She is alert and oriented to person, place, and time.      UC Treatments / Results   Results for orders placed or performed during the hospital encounter of 12/28/18  Urine culture  Result Value Ref Range   Specimen Description URINE, RANDOM    Special Requests      NONE Performed at [facility], [address], [place], [place] [address]    Culture MULTIPLE SPECIES PRESENT, SUGGEST RECOLLECTION (A)    Report Status 12/29/2018 FINAL   POCT urinalysis dip (device)  Result Value Ref Range   Glucose, UA NEGATIVE NEGATIVE mg/dL   Bilirubin Urine NEGATIVE NEGATIVE   Ketones, ur NEGATIVE NEGATIVE mg/dL   Specific Gravity, Urine 1.010 1.005 - 1.030   Hgb urine dipstick TRACE (A) NEGATIVE   pH 7.0 5.0 - 8.0   Protein, ur NEGATIVE NEGATIVE mg/dL   Urobilinogen, UA 0.2 0.0 - 1.0 mg/dL   Nitrite NEGATIVE NEGATIVE   Leukocytes, UA NEGATIVE NEGATIVE  Pregnancy, urine POC  Result Value Ref Range   Preg Test, Ur NEGATIVE NEGATIVE    Final Clinical Impressions(s) / UC Diagnoses   Final diagnoses:  Lower abdominal pain  Urinary frequency     Discharge Instructions     Exact source of abdominal discomfort is not clear, but there are some urinary symptoms (frequency) and you were tender in the suprapubic area of the low abdomen.  A urine culture is pending.  The urgent care will contact you if a change in treatment is needed based on culture result.  Prescription for macrobid (nitrofurantoin, antibiotic) was sent to the pharmacy pending urine culture results.  Other possible causes of lower abdominal discomfort could include a  GI bug, GI irritation/colitis, kidney stone, pelvic infection.  Recheck or followup with your primary care provider if not starting to improve in a few days.  Go to the ED for new fever >100.5 and/or severe/sustained (>1 hour) abdominal pain.    ED Prescriptions    Medication Sig Dispense Auth. Provider   nitrofurantoin, macrocrystal-monohydrate, (MACROBID) 100 MG capsule Take 1 capsule (100 mg total) by mouth 2 (two) times daily.  10 capsule Isa RankinMurray, Marcoantonio Legault Wilson, MD       Isa RankinMurray, Nikeisha Klutz Wilson, MD 12/29/18 [phone]

## 2018-12-29 LAB — URINE CULTURE

## 2019-01-01 ENCOUNTER — Encounter: Payer: Self-pay | Admitting: Medical

## 2019-01-01 ENCOUNTER — Ambulatory Visit (HOSPITAL_BASED_OUTPATIENT_CLINIC_OR_DEPARTMENT_OTHER)
Admission: RE | Admit: 2019-01-01 | Discharge: 2019-01-01 | Disposition: A | Discharge status: Home / Self Care | Payer: BLUE CROSS/BLUE SHIELD | Source: Ambulatory Visit | Attending: Medical | Admitting: Medical

## 2019-01-01 ENCOUNTER — Ambulatory Visit (INDEPENDENT_AMBULATORY_CARE_PROVIDER_SITE_OTHER): Payer: BLUE CROSS/BLUE SHIELD | Admitting: Medical

## 2019-01-01 VITALS — BP 123/86 | HR 86 | Temp 98.0°F | Resp 16 | Ht 65.0 in | Wt 137.6 lb

## 2019-01-01 DIAGNOSIS — M545 Low back pain, unspecified: Secondary | ICD-10-CM

## 2019-01-01 DIAGNOSIS — M5442 Lumbago with sciatica, left side: Secondary | ICD-10-CM | POA: Insufficient documentation

## 2019-01-01 DIAGNOSIS — R109 Unspecified abdominal pain: Secondary | ICD-10-CM | POA: Diagnosis not present

## 2019-01-01 LAB — COMPREHENSIVE METABOLIC PANEL
ALT: 8 U/L (ref 0–35)
AST: 10 U/L (ref 0–37)
Albumin: 4.2 g/dL (ref 3.5–5.2)
Alkaline Phosphatase: 60 U/L (ref 39–117)
BUN: 9 mg/dL (ref 6–23)
CO2: 25 mEq/L (ref 19–32)
Calcium: 9.4 mg/dL (ref 8.4–10.5)
Chloride: 104 mEq/L (ref 96–112)
Creatinine, Ser: 0.75 mg/dL (ref 0.40–1.20)
GFR: 92.52 mL/min (ref >60.00)
GLUCOSE: 93 mg/dL (ref 70–99)
Potassium: 5.1 mEq/L (ref 3.5–5.1)
Sodium: 136 mEq/L (ref 135–145)
Total Bilirubin: 0.5 mg/dL (ref 0.2–1.2)
Total Protein: 7.1 g/dL (ref 6.0–8.3)

## 2019-01-01 LAB — POC URINALSYSI DIPSTICK (AUTOMATED)
BILIRUBIN UA: NEGATIVE
Blood, UA: NEGATIVE
Glucose, UA: NEGATIVE
KETONES UA: NEGATIVE
Leukocytes, UA: NEGATIVE
Nitrite, UA: NEGATIVE
Protein, UA: NEGATIVE
Spec Grav, UA: <=1.005 — AB (ref 1.010–1.025)
Urobilinogen, UA: NEGATIVE E.U./dL — AB
pH, UA: 6 (ref 5.0–8.0)

## 2019-01-01 LAB — CBC WITH DIFFERENTIAL/PLATELET
BASOS ABS: 0 10*3/uL (ref 0.0–0.1)
Basophils Relative: 0.3 % (ref 0.0–3.0)
EOS PCT: 0.5 % (ref 0.0–5.0)
Eosinophils Absolute: 0.1 10*3/uL (ref 0.0–0.7)
HCT: 40.5 % (ref 36.0–46.0)
Hemoglobin: 13.8 g/dL (ref 12.0–15.0)
Lymphocytes Relative: 17.5 % (ref 12.0–46.0)
Lymphs Abs: 2.1 10*3/uL (ref 0.7–4.0)
MCHC: 34 g/dL (ref 30.0–36.0)
MCV: 89.9 fl (ref 78.0–100.0)
Monocytes Absolute: 0.9 10*3/uL (ref 0.1–1.0)
Monocytes Relative: 7.2 % (ref 3.0–12.0)
NEUTROS PCT: 74.5 % (ref 43.0–77.0)
Neutro Abs: 8.8 10*3/uL — ABNORMAL HIGH (ref 1.4–7.7)
Platelets: 288 10*3/uL (ref 150.0–400.0)
RBC: 4.5 Mil/uL (ref 3.87–5.11)
RDW: 13.4 % (ref 11.5–15.5)
WBC: 11.8 10*3/uL — ABNORMAL HIGH (ref 4.0–10.5)

## 2019-01-01 LAB — AMYLASE: Amylase: 27 U/L (ref 27–131)

## 2019-01-01 LAB — LIPASE: Lipase: 22 U/L (ref 11.0–59.0)

## 2019-01-01 MED ORDER — HYDROCODONE-ACETAMINOPHEN 5-325 MG PO TABS: 1.0000 | ORAL_TABLET | Freq: Four times a day (QID) | ORAL | 0 refills | Status: DC | PRN

## 2019-01-01 MED ORDER — ALUM & MAG HYDROXIDE-SIMETH 200-200-20 MG/5ML PO SUSP
30.0000 mL | Freq: Once | ORAL | Status: AC
  Administered 2019-01-01: 30 mL via ORAL

## 2019-01-01 MED ORDER — FAMOTIDINE 20 MG PO TABS: 20.0000 mg | ORAL_TABLET | Freq: Every day | ORAL | 0 refills | Status: DC

## 2019-01-01 MED ORDER — KETOROLAC TROMETHAMINE 30 MG/ML IJ SOLN
30.0000 mg | Freq: Once | INTRAMUSCULAR | Status: AC
  Administered 2019-01-01: 30 mg via INTRAMUSCULAR

## 2019-01-01 MED ORDER — CYCLOBENZAPRINE HCL 5 MG PO TABS: 5.0000 mg | ORAL_TABLET | Freq: Three times a day (TID) | ORAL | 0 refills | Status: DC | PRN

## 2019-01-01 NOTE — Patient Instructions (Addendum)
You had recent acute onset symptoms yesterday that appeared to be sciatic-like with pain radiating from the lumbar region.  We gave you Toradol 30 mg IM injection today.  Making Norco available over the weekend for more severe pain.  Prescribing low-dose Flexeril to take at night over the next 4 days.  Will get lumbar spine x-ray today.  Holding off on giving any nonsteroid anti-inflammatories or any prednisone in light of your recent epigastric region pain.  Once your stomach pain resolves might consider advising low-dose ibuprofen.  Brief atypical transient pain in your chest Saturday that radiated from the epigastric area.  We got an EKG today.  EKG.  It showed normal sinus rhythm.  For your epigastric region pain, we gave you GI cocktail today.  Rx famotidine.  Eat a bland healthy diet.  We will get labs today to include CBC, CMP, amylase, lipase and H. pylori breath test.  Would advise that you continue on Macrobid pending repeat urine culture.  If abdomen pain worsens or for any recurrent chest pain then recommend ED evaluation.  Consider getting CT abdomen pelvis today to evaluate various differential diagnosis but did not think this was indicated presently.  Please MyChart me this coming on Monday for update on how you are doing.  Also would asked that you schedule Tuesday appointment for follow-up.  Note after further discussion decided not to give patient Norco.  She reported syncope with codeine and Percocet in the past.  We will see how she does and they will update me.  Might consider tramadol but would do this as a last resort.

## 2019-01-01 NOTE — Progress Notes (Signed)
Subjective:    Patient ID: Casey Larson, female    DOB: 22-Jan-1981, 38 y.o.   MRN: [id number]  HPI  Pt in state she has some lower back pain for 2 weeks. Lower back ache. She was cleaning house yesterday dusting stood up and felt sharp back pain that radiated to left si area and left buttock. But this morning faint low level pain radiating to left knee. Pt also has some hx of arthritis. Sometimes will get severe ankle pain.(apart from any back pain).  Pt also had some mid abdomen area pain on Sunday morning. Stomach felt uneasy. Abrupt pain in  Stomach that radiated up chest and chest area discomfort went away in 2 seconds. Pt worked up in ED. Treated for bladder infection. Pt has been on macrobid. Pt stomach still feels uneasy. Pt is belching. Every now and then possible sour taste in mouth. Pt uses occasional tums in the past. Recently tums helps just a little at best. No fever, no chills, or sweats. No black or blood stools. Not constipated. Pt has been burping a lot. Burped while in office. No vomiting. But occaional nausea. Some suprapubic area tenderness to palpation.   LMP- Around christmas.  Review of Systems  Constitutional: Negative for chills, fatigue and fever.  Respiratory: Negative for cough, chest tightness, shortness of breath and wheezing.   Cardiovascular: Negative for chest pain and palpitations.  Gastrointestinal: Positive for abdominal pain and nausea.  Musculoskeletal: Positive for back pain. Negative for arthralgias, myalgias and neck stiffness.  Skin: Negative for rash.  Neurological:       Radicular pain.  Hematological: Negative for adenopathy. Does not bruise/bleed easily.  Psychiatric/Behavioral: Negative for behavioral problems, confusion, hallucinations and suicidal ideas. The patient is not hyperactive.     No past medical history on file.   Social History   Socioeconomic History  . Marital status: Married    Spouse name: Not on file  . Number  of children: 3  . Years of education: Not on file  . Highest education level: Not on file  Occupational History    Employer: UNEMPLOYED  Social Needs  . Financial resource strain: Not on file  . Food insecurity:    Worry: Not on file    Inability: Not on file  . Transportation needs:    Medical: Not on file    Non-medical: Not on file  Tobacco Use  . Smoking status: Never Smoker  . Smokeless tobacco: Never Used  Substance and Sexual Activity  . Alcohol use: No  . Drug use: No  . Sexual activity: Yes    Birth control/protection: I.U.D.    Comment: IUD placed 01/2011  Lifestyle  . Physical activity:    Days per week: Not on file    Minutes per session: Not on file  . Stress: Not on file  Relationships  . Social connections:    Talks on phone: Not on file    Gets together: Not on file    Attends religious service: Not on file    Active member of club or organization: Not on file    Attends meetings of clubs or organizations: Not on file    Relationship status: Not on file  . Intimate partner violence:    Fear of current or ex partner: Not on file    Emotionally abused: Not on file    Physically abused: Not on file    Forced sexual activity: Not on file  Other Topics Concern  .  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   Married.          Past Surgical History:  Procedure Laterality Date  . CESAREAN SECTION  3x last being 01/12  . EYE SURGERY     lasik  . LAPAROSCOPIC APPENDECTOMY N/A 12/31/2017   Procedure: APPENDECTOMY LAPAROSCOPIC;  Surgeon: Franky Macho, MD;  Location: [facility];  Service: General;  Laterality: N/A;    Family History  Problem Relation Age of Onset  . Diabetes Father   . Cancer Maternal Grandmother        lung cancer    Allergies  Allergen Reactions  . Codeine     Syncope   . Penicillins Hives    Has patient had a  PCN reaction causing immediate rash, facial/tongue/throat swelling, SOB or lightheadedness with hypotension: Yes Has patient had a PCN reaction causing severe rash involving mucus membranes or skin necrosis: No Has patient had a PCN reaction that required hospitalization: No Has patient had a PCN reaction occurring within the last 10 years: No If all of the above answers are "NO", then may proceed with Cephalosporin use.     Current Outpatient Medications on File Prior to Visit  Medication Sig Dispense Refill  . hydroxypropyl methylcellulose / hypromellose (ISOPTO TEARS / GONIOVISC) 2.5 % ophthalmic solution Place 1 drop into both eyes 3 (three) times daily as needed for dry eyes.    . nitrofurantoin, macrocrystal-monohydrate, (MACROBID) 100 MG capsule Take 1 capsule (100 mg total) by mouth 2 (two) times daily. 10 capsule 0   No current facility-administered medications on file prior to visit.     BP 123/86   Pulse 86   Temp 98 F (36.7 C) (Oral)   Resp 16   Ht 5\' 5"  (1.651 m)   Wt 137 lb 9.6 oz (62.4 kg)   SpO2 100%   BMI 22.90 kg/m       Objective:   Physical Exam  General Mental Status- Alert. General Appearance- Not in acute distress.   Skin General: Color- Normal Color. Moisture- Normal Moisture.  Neck Carotid Arteries- Normal color. Moisture- Normal Moisture. No carotid bruits. No JVD.  Chest and Lung Exam Auscultation: Breath Sounds:-Normal.  Cardiovascular Auscultation:Rythm- Regular. Murmurs & Other Heart Sounds:Auscultation of the heart reveals- No Murmurs.  Abdomen Inspection:-Inspeection Normal. Palpation/Percussion:Note:No mass. Palpation and Percussion of the abdomen reveal- epigastric and mid suprabpubic  Tender(no rt lower or lt lower quadrant pain, Non Distended + BS, no rebound or guarding.(negative heal jar)   Neurologic Cranial Nerve exam:- CN III-XII intact(No nystagmus), symmetric smile. Strength:- 5/5 equal and symmetric strength both  upper and lower extremities.   Back Mid lumbar spine tenderness to palpation. Pain on straight leg lift. Pain on lateral movements and flexion/extension of the spine.  Lower ext neurologic  L5-S1 sensation intact bilaterally. Normal patellar reflexes bilaterally. No foot drop bilaterally.     Assessment & Plan:  You had recent acute onset symptoms yesterday that appeared to be sciatic-like with pain radiating from the lumbar region.  We gave you Toradol 30 mg IM injection today.  Making Norco available over the weekend for more severe pain.  Prescribing low-dose Flexeril to take at night over the next 4 days.  Will get lumbar spine x-ray today.  Holding off on giving any nonsteroid anti-inflammatories or any  prednisone in light of your recent epigastric region pain.  Once your stomach pain resolves might consider advising low-dose ibuprofen.  Brief atypical transient pain in your chest Saturday that radiated from the epigastric area.  We got an EKG today.  EKG.  It showed normal sinus rhythm.  For your epigastric region pain, we gave you GI cocktail today.  Rx famotidine.  Eat a bland healthy diet.  We will get labs today to include CBC, CMP, amylase, lipase and H. pylori breath test.  Would advise that you continue on Macrobid pending repeat urine culture.  If abdomen pain worsens or for any recurrent chest pain then recommend ED evaluation.  Consider getting CT abdomen pelvis today to evaluate various differential diagnosis but did not think this was indicated presently.  Please MyChart me this coming on Monday for update on how you are doing.  Also would asked that you schedule Tuesday appointment for follow-up.  40 minutes spent with pt.50% of time spent counseling on work up and plan going forward as approach weekend  Note after further discussion decided not to give patient Norco.  She reported syncope with codeine and Percocet in the past.  We will see how she does and they  will update me.  Might consider tramadol but would do this as a last resort.  Esperanza Richters, PA-C

## 2019-01-02 LAB — URINE CULTURE
MICRO NUMBER:: 39267
Result:: NO GROWTH
SPECIMEN QUALITY: ADEQUATE

## 2019-01-03 ENCOUNTER — Encounter: Payer: Self-pay | Admitting: General Surgery

## 2019-01-04 LAB — H. PYLORI BREATH TEST: H. pylori Breath Test: NOT DETECTED

## 2019-01-05 ENCOUNTER — Encounter: Payer: Self-pay | Admitting: Medical

## 2019-01-05 ENCOUNTER — Ambulatory Visit (INDEPENDENT_AMBULATORY_CARE_PROVIDER_SITE_OTHER): Payer: BLUE CROSS/BLUE SHIELD | Admitting: Medical

## 2019-01-05 VITALS — BP 118/73 | HR 81 | Temp 97.8°F | Resp 16 | Ht 65.0 in | Wt 138.4 lb

## 2019-01-05 DIAGNOSIS — M545 Low back pain, unspecified: Secondary | ICD-10-CM

## 2019-01-05 DIAGNOSIS — R109 Unspecified abdominal pain: Secondary | ICD-10-CM

## 2019-01-05 MED ORDER — CYCLOBENZAPRINE HCL 5 MG PO TABS: 5.0000 mg | ORAL_TABLET | Freq: Three times a day (TID) | ORAL | 0 refills | Status: DC | PRN

## 2019-01-05 NOTE — Patient Instructions (Addendum)
Your abdomen pain seem to resolve pretty quickly with the GI cocktail we gave you on last visit.  Your lab work-up was not negative.  You decided not to use famotidine.  You have some recurrent mild symptoms since yesterday.  I would recommend that you go ahead and start the famotidine and still eat a healthy diet.  Recommend using famotidine for a couple of weeks at least.  If this helps and the pain returns then might consider referral to gastro MD for possible EGD.  If pain worsens or changes please let us know.  Your back pain is much improved recently.  But still some direct left SI area tenderness on palpation.  Now that your stomach is significantly improved you should be able to tolerate low dose ibuprofen 200 to 400 mg every 8 hours.  You could use this for the next 4 to 5 days.  Also refilled your low-dose Flexeril you can use for next 5 nights.  Can start back stretching exercises as tolerated.  If sciatica type pain is still lingering by next week/7 days then could refer to sports medicine for evaluation and possible physical therapy.  At this point I think you could update [place] in about 10 days how you are doing regarding your stomach pain and how you are doing with your back pain.   Back Exercises The following exercises strengthen the muscles that help to support the back. They also help to keep the lower back flexible. Doing these exercises can help to prevent back pain or lessen existing pain. If you have back pain or discomfort, try doing these exercises 2-3 times each day or as told by your health care provider. When the pain goes away, do them once each day, but increase the number of times that you repeat the steps for each exercise (do more repetitions). If you do not have back pain or discomfort, do these exercises once each day or as told by your health care provider. Exercises Single Knee to Chest Repeat these steps 3-5 times for each leg: 1. Lie on your back on a firm bed or the  floor with your legs extended. 2. Bring one knee to your chest. Your other leg should stay extended and in contact with the floor. 3. Hold your knee in place by grabbing your knee or thigh. 4. Pull on your knee until you feel a gentle stretch in your lower back. 5. Hold the stretch for 10-30 seconds. 6. Slowly release and straighten your leg. Pelvic Tilt Repeat these steps 5-10 times: 1. Lie on your back on a firm bed or the floor with your legs extended. 2. Bend your knees so they are pointing toward the ceiling and your feet are flat on the floor. 3. Tighten your lower abdominal muscles to press your lower back against the floor. This motion will tilt your pelvis so your tailbone points up toward the ceiling instead of pointing to your feet or the floor. 4. With gentle tension and even breathing, hold this position for 5-10 seconds. Cat-Cow Repeat these steps until your lower back becomes more flexible: 1. Get into a hands-and-knees position on a firm surface. Keep your hands under your shoulders, and keep your knees under your hips. You may place padding under your knees for comfort. 2. Let your head hang down, and point your tailbone toward the floor so your lower back becomes rounded like the back of a cat. 3. Hold this position for 5 seconds. 4. Slowly lift  your head and point your tailbone up toward the ceiling so your back forms a sagging arch like the back of a cow. 5. Hold this position for 5 seconds.  Press-Ups Repeat these steps 5-10 times: 1. Lie on your abdomen (face-down) on the floor. 2. Place your palms near your head, about shoulder-width apart. 3. While you keep your back as relaxed as possible and keep your hips on the floor, slowly straighten your arms to raise the top half of your body and lift your shoulders. Do not use your back muscles to raise your upper torso. You may adjust the placement of your hands to make yourself more comfortable. 4. Hold this position for 5  seconds while you keep your back relaxed. 5. Slowly return to lying flat on the floor.  Bridges Repeat these steps 10 times: 1. Lie on your back on a firm surface. 2. Bend your knees so they are pointing toward the ceiling and your feet are flat on the floor. 3. Tighten your buttocks muscles and lift your buttocks off of the floor until your waist is at almost the same height as your knees. You should feel the muscles working in your buttocks and the back of your thighs. If you do not feel these muscles, slide your feet 1-2 inches farther away from your buttocks. 4. Hold this position for 3-5 seconds. 5. Slowly lower your hips to the starting position, and allow your buttocks muscles to relax completely. If this exercise is too easy, try doing it with your arms crossed over your chest. Abdominal Crunches Repeat these steps 5-10 times: 1. Lie on your back on a firm bed or the floor with your legs extended. 2. Bend your knees so they are pointing toward the ceiling and your feet are flat on the floor. 3. Cross your arms over your chest. 4. Tip your chin slightly toward your chest without bending your neck. 5. Tighten your abdominal muscles and slowly raise your trunk (torso) high enough to lift your shoulder blades a tiny bit off of the floor. Avoid raising your torso higher than that, because it can put too much stress on your low back and it does not help to strengthen your abdominal muscles. 6. Slowly return to your starting position. Back Lifts Repeat these steps 5-10 times: 1. Lie on your abdomen (face-down) with your arms at your sides, and rest your forehead on the floor. 2. Tighten the muscles in your legs and your buttocks. 3. Slowly lift your chest off of the floor while you keep your hips pressed to the floor. Keep the back of your head in line with the curve in your back. Your eyes should be looking at the floor. 4. Hold this position for 3-5 seconds. 5. Slowly return to your  starting position. Contact a health care provider if:  Your back pain or discomfort gets much worse when you do an exercise.  Your back pain or discomfort does not lessen within 2 hours after you exercise. If you have any of these problems, stop doing these exercises right away. Do not do them again unless your health care provider says that you can. Get help right away if:  You develop sudden, severe back pain. If this happens, stop doing the exercises right away. Do not do them again unless your health care provider says that you can. This information is not intended to replace advice given to you by your health care provider. Make sure you discuss any questions you  have with your health care provider. Document Released: 01/16/2005 Document Revised: 04/14/2018 Document Reviewed: 02/02/2015 Elsevier Interactive Patient Education  [employer].

## 2019-01-05 NOTE — Progress Notes (Signed)
Subjective:    Patient ID: Casey Larson, female    DOB: 1981-04-29, 38 y.o.   MRN: [id number]  HPI   Pt in for follow up.  Pt states her abdomen pain did resolve up until yesterday. Last night got epigastric tenderness. Pain resolved when she slept. Today some mild belching this morning with acid taste in throat.   Pt stats stomach seemed to get better almost immediate with gi cocktail. Pt never took the famotidine.   No uti symptoms know.  Pt pain feels better. Just hurts little with certain positions. Pain same local lt si area. Pain not radiating as much.(pain is less than before. States about 50% less than other day. Level 3/10 now with movement)  Negative xray of l spine. Pt has been tylenol and flexeril. Did not give nsaid last time due to concern /pain abdomen   Review of Systems  Constitutional: Negative for chills, fatigue and fever.  HENT: Positive for congestion. Negative for ear discharge and ear pain.        Minimal. Pt declines andy meds for this.  Respiratory: Negative for cough, chest tightness, shortness of breath and wheezing.   Cardiovascular: Negative for chest pain and palpitations.  Gastrointestinal: Negative for abdominal distention, abdominal pain, anal bleeding and blood in stool.       See hpi.  Genitourinary: Negative for difficulty urinating, dysuria, flank pain, genital sores, hematuria and urgency.  Musculoskeletal: Positive for back pain.  Neurological: Negative for dizziness, seizures and light-headedness.  Hematological: Negative for adenopathy. Does not bruise/bleed easily.  Psychiatric/Behavioral: Negative for behavioral problems, confusion and hallucinations. The patient is not nervous/anxious.     No past medical history on file.   Social History   Socioeconomic History  . Marital status: Married    Spouse name: Not on file  . Number of children: 3  . Years of education: Not on file  . Highest education level: Not on file    Occupational History    Employer: UNEMPLOYED  Social Needs  . Financial resource strain: Not on file  . Food insecurity:    Worry: Not on file    Inability: Not on file  . Transportation needs:    Medical: Not on file    Non-medical: Not on file  Tobacco Use  . Smoking status: Never Smoker  . Smokeless tobacco: Never Used  Substance and Sexual Activity  . Alcohol use: No  . Drug use: No  . Sexual activity: Yes    Birth control/protection: I.U.D.    Comment: IUD placed 01/2011  Lifestyle  . Physical activity:    Days per week: Not on file    Minutes per session: Not on file  . Stress: Not on file  Relationships  . Social connections:    Talks on phone: Not on file    Gets together: Not on file    Attends religious service: Not on file    Active member of club or organization: Not on file    Attends meetings of clubs or organizations: Not on file    Relationship status: Not on file  . Intimate partner violence:    Fear of current or ex partner: Not on file    Emotionally abused: Not on file    Physically abused: Not on file    Forced sexual activity: Not on file  Other Topics Concern  . 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   Married.          Past Surgical History:  Procedure Laterality Date  . CESAREAN SECTION  3x last being 01/12  . EYE SURGERY     lasik  . LAPAROSCOPIC APPENDECTOMY N/A 12/31/2017   Procedure: APPENDECTOMY LAPAROSCOPIC;  Surgeon: Franky MachoJenkins, Mark, MD;  Location: [facility];  Service: General;  Laterality: N/A;    Family History  Problem Relation Age of Onset  . Diabetes Father   . Cancer Maternal Grandmother        lung cancer    Allergies  Allergen Reactions  . Codeine     Syncope   . Penicillins Hives    Has patient had a PCN reaction causing immediate rash, facial/tongue/throat swelling, SOB or lightheadedness  with hypotension: Yes Has patient had a PCN reaction causing severe rash involving mucus membranes or skin necrosis: No Has patient had a PCN reaction that required hospitalization: No Has patient had a PCN reaction occurring within the last 10 years: No If all of the above answers are "NO", then may proceed with Cephalosporin use.     Current Outpatient Medications on File Prior to Visit  Medication Sig Dispense Refill  . cyclobenzaprine (FLEXERIL) 5 MG tablet Take 1 tablet (5 mg total) by mouth 3 (three) times daily as needed for muscle spasms. 4 tablet 0  . famotidine (PEPCID) 20 MG tablet Take 1 tablet (20 mg total) by mouth daily. 30 tablet 0   No current facility-administered medications on file prior to visit.     BP 118/73   Pulse 81   Temp 97.8 F (36.6 C) (Oral)   Resp 16   Ht 5\' 5"  (1.651 m)   Wt 138 lb 6.4 oz (62.8 kg)   SpO2 100%   BMI 23.03 kg/m       Objective:   Physical Exam  General Mental Status- Alert. General Appearance- Not in acute distress.   Skin General: Color- Normal Color. Moisture- Normal Moisture.  Neck Carotid Arteries- Normal color. Moisture- Normal Moisture. No carotid bruits. No JVD.  Chest and Lung Exam Auscultation: Breath Sounds:-Normal.  Cardiovascular Auscultation:Rythm- Regular. Murmurs & Other Heart Sounds:Auscultation of the heart reveals- No Murmurs.  Abdomen Inspection:-Inspeection Normal. Palpation/Percussion:Note:No mass. Palpation and Percussion of the abdomen reveal- epigastric faint tender. No  suprabpubic  Tenderness(no rt lower or lt lower quadrant pain, Non Distended + BS, no rebound or guarding.   Neurologic Cranial Nerve exam:- CN III-XII intact(No nystagmus), symmetric smile. Strength:- 5/5 equal and symmetric strength both upper and lower extremities.   Back No Mid lumbar spine tenderness to palpation. Left si tenderness today. No pain on straight leg lift. Pain on lateral movements and  flexion/extension of the spine.  Lower ext neurologic  L5-S1 sensation intact bilaterally. Normal patellar reflexes bilaterally. No foot drop bilaterally. .     Assessment & Plan:  Your abdomen pain seem to resolve pretty quickly with the GI cocktail we gave you on last visit.  Your lab work-up was not negative.  You decided not to use famotidine.  You have some recurrent mild symptoms since yesterday.  I would recommend that you go ahead and start the famotidine and still eat a healthy diet.  Recommend using famotidine for a couple of weeks at least.  If this helps and the pain returns then might consider referral to gastro MD for possible EGD.  If pain worsens or changes please let us know.  Your back pain is much improved recently.  But still some direct left SI area tenderness on palpation.  Now that your stomach is significantly improved you should be able to tolerate low dose ibuprofen 200 to 400 mg every 8 hours.  You could use this for the next 4 to 5 days.  Also refilled your low-dose Flexeril you can use for next 5 nights.  Can start back stretching exercises as tolerated.  If sciatica type pain is still lingering by next week/7 days then could refer to sports medicine for evaluation and possible physical therapy.  At this point I think you could update [place] in about 10 days how you are doing regarding your stomach pain and how you are doing with your back pain.  Esperanza Richters, PA-C

## 2019-01-07 ENCOUNTER — Telehealth: Payer: Self-pay | Admitting: Medical

## 2019-01-07 ENCOUNTER — Encounter: Payer: Self-pay | Admitting: Medical

## 2019-01-07 DIAGNOSIS — K219 Gastro-esophageal reflux disease without esophagitis: Secondary | ICD-10-CM

## 2019-01-07 MED ORDER — OMEPRAZOLE 20 MG PO CPDR: 20.0000 mg | DELAYED_RELEASE_CAPSULE | Freq: Every day | ORAL | 3 refills | Status: DC

## 2019-01-07 NOTE — Telephone Encounter (Signed)
Rx prilosec and referral to GI MD.

## 2019-01-07 NOTE — Telephone Encounter (Signed)
Rx omeprazole sent to pt pharmacy. 

## 2019-01-08 ENCOUNTER — Encounter: Payer: Self-pay | Admitting: Gastroenterology

## 2019-01-25 DIAGNOSIS — R1013 Epigastric pain: Secondary | ICD-10-CM | POA: Insufficient documentation

## 2019-01-25 DIAGNOSIS — R0789 Other chest pain: Secondary | ICD-10-CM | POA: Insufficient documentation

## 2019-01-25 DIAGNOSIS — Z79899 Other long term (current) drug therapy: Secondary | ICD-10-CM | POA: Diagnosis not present

## 2019-01-25 DIAGNOSIS — R110 Nausea: Secondary | ICD-10-CM | POA: Insufficient documentation

## 2019-01-25 DIAGNOSIS — R109 Unspecified abdominal pain: Secondary | ICD-10-CM | POA: Diagnosis not present

## 2019-01-25 DIAGNOSIS — R05 Cough: Secondary | ICD-10-CM | POA: Diagnosis not present

## 2019-01-25 DIAGNOSIS — R7989 Other specified abnormal findings of blood chemistry: Secondary | ICD-10-CM | POA: Diagnosis not present

## 2019-01-25 DIAGNOSIS — R079 Chest pain, unspecified: Secondary | ICD-10-CM | POA: Diagnosis not present

## 2019-01-26 ENCOUNTER — Emergency Department (HOSPITAL_COMMUNITY): Payer: BLUE CROSS/BLUE SHIELD

## 2019-01-26 ENCOUNTER — Other Ambulatory Visit: Payer: Self-pay

## 2019-01-26 ENCOUNTER — Encounter (HOSPITAL_COMMUNITY): Payer: Self-pay | Admitting: *Deleted

## 2019-01-26 ENCOUNTER — Emergency Department (HOSPITAL_COMMUNITY)
Admission: EM | Admit: 2019-01-26 | Discharge: 2019-01-26 | Disposition: A | Discharge status: Home / Self Care | Payer: BLUE CROSS/BLUE SHIELD | Attending: Emergency Medicine | Admitting: Emergency Medicine

## 2019-01-26 DIAGNOSIS — R079 Chest pain, unspecified: Secondary | ICD-10-CM | POA: Diagnosis not present

## 2019-01-26 DIAGNOSIS — R0789 Other chest pain: Secondary | ICD-10-CM

## 2019-01-26 DIAGNOSIS — R7989 Other specified abnormal findings of blood chemistry: Secondary | ICD-10-CM | POA: Diagnosis not present

## 2019-01-26 DIAGNOSIS — R05 Cough: Secondary | ICD-10-CM | POA: Diagnosis not present

## 2019-01-26 HISTORY — DX: Gastro-esophageal reflux disease without esophagitis: K21.9

## 2019-01-26 LAB — COMPREHENSIVE METABOLIC PANEL
ALBUMIN: 4.3 g/dL (ref 3.5–5.0)
ALT: 10 U/L (ref 0–44)
AST: 13 U/L — ABNORMAL LOW (ref 15–41)
Alkaline Phosphatase: 48 U/L (ref 38–126)
Anion gap: 8 (ref 5–15)
BUN: 9 mg/dL (ref 6–20)
CO2: 23 mmol/L (ref 22–32)
CREATININE: 0.69 mg/dL (ref 0.44–1.00)
Calcium: 9.1 mg/dL (ref 8.9–10.3)
Chloride: 106 mmol/L (ref 98–111)
GFR calc Af Amer: >60 mL/min (ref >60)
GFR calc non Af Amer: >60 mL/min (ref >60)
GLUCOSE: 98 mg/dL (ref 70–99)
Potassium: 3.6 mmol/L (ref 3.5–5.1)
SODIUM: 137 mmol/L (ref 135–145)
Total Bilirubin: 0.4 mg/dL (ref 0.3–1.2)
Total Protein: 7.9 g/dL (ref 6.5–8.1)

## 2019-01-26 LAB — TROPONIN I
Troponin I: <0.03 ng/mL (ref <0.03)
Troponin I: <0.03 ng/mL (ref <0.03)

## 2019-01-26 LAB — CBC WITH DIFFERENTIAL/PLATELET
Abs Immature Granulocytes: 0.02 10*3/uL (ref 0.00–0.07)
Basophils Absolute: 0 10*3/uL (ref 0.0–0.1)
Basophils Relative: 0 %
Eosinophils Absolute: 0.2 10*3/uL (ref 0.0–0.5)
Eosinophils Relative: 2 %
HCT: 41.5 % (ref 36.0–46.0)
Hemoglobin: 13.6 g/dL (ref 12.0–15.0)
Immature Granulocytes: 0 %
Lymphocytes Relative: 37 %
Lymphs Abs: 4 10*3/uL (ref 0.7–4.0)
MCH: 29.5 pg (ref 26.0–34.0)
MCHC: 32.8 g/dL (ref 30.0–36.0)
MCV: 90 fL (ref 80.0–100.0)
Monocytes Absolute: 0.6 10*3/uL (ref 0.1–1.0)
Monocytes Relative: 6 %
Neutro Abs: 5.8 10*3/uL (ref 1.7–7.7)
Neutrophils Relative %: 55 %
Platelets: 290 10*3/uL (ref 150–400)
RBC: 4.61 MIL/uL (ref 3.87–5.11)
RDW: 12.8 % (ref 11.5–15.5)
WBC: 10.7 10*3/uL — ABNORMAL HIGH (ref 4.0–10.5)
nRBC: 0 % (ref 0.0–0.2)

## 2019-01-26 LAB — D-DIMER, QUANTITATIVE: D-Dimer, Quant: 0.65 ug/mL-FEU — ABNORMAL HIGH (ref 0.00–0.50)

## 2019-01-26 LAB — LIPASE, BLOOD: Lipase: 35 U/L (ref 11–51)

## 2019-01-26 MED ORDER — IOPAMIDOL (ISOVUE-370) INJECTION 76%
100.0000 mL | Freq: Once | INTRAVENOUS | Status: AC | PRN
  Administered 2019-01-26: 100 mL via INTRAVENOUS

## 2019-01-26 MED ORDER — LIDOCAINE VISCOUS HCL 2 % MT SOLN
15.0000 mL | Freq: Once | OROMUCOSAL | Status: DC
  Filled 2019-01-26: qty 15

## 2019-01-26 MED ORDER — ALUM & MAG HYDROXIDE-SIMETH 200-200-20 MG/5ML PO SUSP
30.0000 mL | Freq: Once | ORAL | Status: AC
  Administered 2019-01-26: 30 mL via ORAL
  Filled 2019-01-26: qty 30

## 2019-01-26 NOTE — ED Triage Notes (Signed)
Pt c/o chest pain x 2 days and states the pain is like a pressure; pt states she was sitting in a chair watching TV when the pain started; pt states she was diagnosed with acid reflux x one month ago

## 2019-01-26 NOTE — Discharge Instructions (Signed)
There is no evidence of heart attack or blood clot in the lung.  Continue your stomach medications as prescribed.  Avoid alcohol, caffeine, NSAIDs, spicy foods.  Return to the ED if your chest pain becomes exertional or pleuritic, is associated with shortness of breath, sweating, vomiting or other concerns.

## 2019-01-26 NOTE — ED Provider Notes (Signed)
[facility] EMERGENCY DEPARTMENT Provider Note   CSN: [id number] Arrival date & time: 01/25/19  2358     History   Chief Complaint Chief Complaint  Patient presents with  . Chest Pain    HPI Casey Larson is a 38 y.o. female.  Patient presents with central chest pain and pressure that onset several hours ago while she was sitting in a chair watching TV.  The pain is constant, does not radiate to her arm, back or neck.  Associated with pressure.  There is nausea but no vomiting.  No shortness of breath, diaphoresis, dizziness or lightheadedness.  She had similar pain last evening and night that resolved after several hours.  She was recently diagnosed with acid reflux disease and takes Prilosec and Pepcid.  She reports not having this pain in the past.  Denies any recent alcohol, caffeine, spicy foods, NSAID medication.  Denies any cardiac history.  No blood thinner use birth control use.  No leg pain or leg swelling.  Pain is not positional or associated with eating.  No abdominal pain at this time.  The history is provided by the patient.  Chest Pain  Associated symptoms: abdominal pain and nausea   Associated symptoms: no dizziness, no fatigue, no fever, no headache, no vomiting and no weakness     Past Medical History:  Diagnosis Date  . Acid reflux     Patient Active Problem List   Diagnosis Date Noted  . Acute appendicitis 12/30/2017  . Appendicitis 12/30/2017  . Vasovagal near syncope 03/12/2014  . URI (upper respiratory infection) 10/02/2012  . Left breast mass 10/02/2012    Past Surgical History:  Procedure Laterality Date  . CESAREAN SECTION  3x last being 01/12  . EYE SURGERY     lasik  . LAPAROSCOPIC APPENDECTOMY N/A 12/31/2017   Procedure: APPENDECTOMY LAPAROSCOPIC;  Surgeon: Franky Macho, MD;  Location: [facility];  Service: General;  Laterality: N/A;     OB History   No obstetric history on file.      Home Medications    Prior to Admission  medications   Medication Sig Start Date End Date Taking? Authorizing Provider  cyclobenzaprine (FLEXERIL) 5 MG tablet Take 1 tablet (5 mg total) by mouth 3 (three) times daily as needed for muscle spasms. 01/05/19   Saguier, Ramon Dredge, PA-C  famotidine (PEPCID) 20 MG tablet Take 1 tablet (20 mg total) by mouth daily. 01/01/19   Saguier, Ramon Dredge, PA-C  omeprazole (PRILOSEC) 20 MG capsule Take 1 capsule (20 mg total) by mouth daily. 01/07/19   Saguier, Ramon Dredge, PA-C    Family History Family History  Problem Relation Age of Onset  . Diabetes Father   . Cancer Maternal Grandmother        lung cancer    Social History Social History   Tobacco Use  . Smoking status: Never Smoker  . Smokeless tobacco: Never Used  Substance Use Topics  . Alcohol use: No  . Drug use: No     Allergies   Codeine and Penicillins   Review of Systems Review of Systems  Constitutional: Negative for activity change, appetite change, fatigue and fever.  HENT: Negative for congestion and rhinorrhea.   Eyes: Negative for visual disturbance.  Respiratory: Positive for chest tightness.   Cardiovascular: Positive for chest pain.  Gastrointestinal: Positive for abdominal pain and nausea. Negative for vomiting.  Genitourinary: Negative for dysuria and hematuria.  Musculoskeletal: Negative for arthralgias and myalgias.  Skin: Negative for rash.  Neurological:  Negative for dizziness, weakness and headaches.    all other systems are negative except as noted in the HPI and PMH.    Physical Exam Updated Vital Signs BP 132/77 (BP Location: Left Arm)   Pulse (!) 104   Temp 97.6 F (36.4 C) (Oral)   Resp 16   Ht 5\' 5"  (1.651 m)   Wt 62.7 kg   LMP 01/23/2019   SpO2 98%   BMI 23.00 kg/m   Physical Exam Vitals signs and nursing note reviewed.  Constitutional:      General: She is not in acute distress.    Appearance: She is well-developed.  HENT:     Head: Normocephalic and atraumatic.     Mouth/Throat:      Pharynx: No oropharyngeal exudate.  Eyes:     Conjunctiva/sclera: Conjunctivae normal.     Pupils: Pupils are equal, round, and reactive to light.  Neck:     Musculoskeletal: Normal range of motion and neck supple.     Comments: No meningismus. Cardiovascular:     Rate and Rhythm: Normal rate and regular rhythm.     Heart sounds: Normal heart sounds. No murmur.  Pulmonary:     Effort: Pulmonary effort is normal. No respiratory distress.     Breath sounds: Normal breath sounds.  Chest:     Chest wall: No tenderness.  Abdominal:     Palpations: Abdomen is soft.     Tenderness: There is no abdominal tenderness. There is no guarding or rebound.     Comments: Minimal epigastric tenderness  Musculoskeletal: Normal range of motion.        General: No tenderness.  Skin:    General: Skin is warm.     Capillary Refill: Capillary refill takes less than 2 seconds.  Neurological:     General: No focal deficit present.     Mental Status: She is alert and oriented to person, place, and time. Mental status is at baseline.     Cranial Nerves: No cranial nerve deficit.     Motor: No abnormal muscle tone.     Coordination: Coordination normal.     Comments: No ataxia on finger to nose bilaterally. No pronator drift. 5/5 strength throughout. CN 2-12 intact.Equal grip strength. Sensation intact.   Psychiatric:        Behavior: Behavior normal.      ED Treatments / Results  Labs (all labs ordered are listed, but only abnormal results are displayed) Labs Reviewed  CBC WITH DIFFERENTIAL/PLATELET - Abnormal; Notable for the following components:      Result Value   WBC 10.7 (*)    All other components within normal limits  COMPREHENSIVE METABOLIC PANEL - Abnormal; Notable for the following components:   AST 13 (*)    All other components within normal limits  D-DIMER, QUANTITATIVE (NOT AT [facility]) - Abnormal; Notable for the following components:   D-Dimer, Quant 0.65 (*)    All other  components within normal limits  LIPASE, BLOOD  TROPONIN I  TROPONIN I    EKG None  Radiology Dg Chest 2 View  Result Date: 01/26/2019 CLINICAL DATA:  Chest pain going to the left for couple of nights. Cough. EXAM: CHEST - 2 VIEW COMPARISON:  None. FINDINGS: Hyperinflation suggesting emphysematous change. Heart size and pulmonary vascularity are normal. No airspace disease or consolidation in the lungs. No blunting of costophrenic angles. No pneumothorax. Mediastinal contours appear intact. Mild degenerative changes in the midthoracic spine. IMPRESSION: Emphysematous changes in the  lungs. No evidence of active pulmonary disease. Electronically Signed   By: Burman NievesWilliam  Stevens M.D.   On: 01/26/2019 01:10   Ct Angio Chest Pe W And/or Wo Contrast  Result Date: 01/26/2019 CLINICAL DATA:  Chest pain for 2 days. Positive D-dimer. EXAM: CT ANGIOGRAPHY CHEST WITH CONTRAST TECHNIQUE: Multidetector CT imaging of the chest was performed using the standard protocol during bolus administration of intravenous contrast. Multiplanar CT image reconstructions and MIPs were obtained to evaluate the vascular anatomy. CONTRAST:  100mL ISOVUE-370 IOPAMIDOL (ISOVUE-370) INJECTION 76% COMPARISON:  CT abdomen and pelvis 12/30/2017 FINDINGS: Cardiovascular: Good opacification of the central and segmental pulmonary arteries. No focal filling defects. No evidence of significant pulmonary embolus. Normal heart size. No pericardial effusions. Normal caliber thoracic aorta. No aortic dissection. Great vessel origins are patent. Mediastinum/Nodes: Esophagus is decompressed. No significant lymphadenopathy in the chest. Lungs/Pleura: Lungs are clear. No airspace disease or consolidation. No pleural effusions. No pneumothorax. Airways are patent. Upper Abdomen: No acute abnormalities identified. Musculoskeletal: No destructive bone lesions. Mild degenerative changes in the thoracic spine. Review of the MIP images confirms the above  findings. IMPRESSION: No evidence of significant pulmonary embolus. No evidence of active pulmonary disease. Electronically Signed   By: Burman NievesWilliam  Stevens M.D.   On: 01/26/2019 01:36    Procedures Procedures (including critical care time)  Medications Ordered in ED Medications  alum & mag hydroxide-simeth (MAALOX/MYLANTA) 200-200-20 MG/5ML suspension 30 mL (has no administration in time range)     Initial Impression / Assessment and Plan / ED Course  I have reviewed the triage vital signs and the nursing notes.  Pertinent labs & imaging results that were available during my care of the patient were reviewed by me and considered in my medical decision making (see chart for details).    Several hours of chest pain that is not exertional or pleuritic.  The EKG is sinus rhythm without acute ST changes.  Recent diagnosis of acid reflux, suspect same causing her symptoms at this time.  Low suspicion for ACS, PE, aortic dissection  Troponin negative x2.  D-dimer is positive with subsequent CT PE is negative. HEART score 1.   Patient's pain has improved with GI cocktail medications in the ED.  Advised her to continue her PPI.  Avoid alcohol, NSAIDs, caffeine, spicy foods.  Follow-up with primary doctor for a stress test.  Return precautions discussed including exertional chest pain, shortness of breath, vomiting, any other concerns.  ED ECG REPORT   Date: 01/26/2019  Rate: 102  Rhythm: sinus tachycardia  QRS Axis: normal  Intervals: normal  ST/T Wave abnormalities: normal  Conduction Disutrbances:none  Narrative Interpretation:   Old EKG Reviewed: none available  I have personally reviewed the EKG tracing and agree with the computerized printout as noted.   Final Clinical Impressions(s) / ED Diagnoses   Final diagnoses:  Atypical chest pain    ED Discharge Orders    None       Kameren Pargas, Jeannett SeniorStephen, MD 01/26/19 [phone]

## 2019-01-28 ENCOUNTER — Encounter: Payer: Self-pay | Admitting: Gastroenterology

## 2019-01-28 ENCOUNTER — Ambulatory Visit (INDEPENDENT_AMBULATORY_CARE_PROVIDER_SITE_OTHER): Payer: BLUE CROSS/BLUE SHIELD | Admitting: Gastroenterology

## 2019-01-28 VITALS — BP 100/70 | HR 88 | Ht 65.0 in | Wt 134.0 lb

## 2019-01-28 DIAGNOSIS — R0789 Other chest pain: Secondary | ICD-10-CM

## 2019-01-28 DIAGNOSIS — R1013 Epigastric pain: Secondary | ICD-10-CM

## 2019-01-28 DIAGNOSIS — R12 Heartburn: Secondary | ICD-10-CM | POA: Diagnosis not present

## 2019-01-28 MED ORDER — OMEPRAZOLE 40 MG PO CPDR: 40.0000 mg | DELAYED_RELEASE_CAPSULE | Freq: Two times a day (BID) | ORAL | 2 refills | Status: DC

## 2019-01-28 NOTE — Progress Notes (Signed)
GASTROENTEROLOGY OUTPATIENT CLINIC VISIT   Primary Care Provider Sandford CrazeO'Sullivan, Melissa, NP [address] [place] [id number] [phone]  Referring Provider Marisue BrooklynSaguier, Edward, PA-C [address] [place] [id number] [phone]  Patient Profile: Iline OvenMelanie D Gionfriddo is a 38 y.o. female with a pmh significant for reported GERD status post appendectomy.  The patient presents to the [facility] for an evaluation and management of problem(s) noted below:  Problem List 1. Midepigastric pain   2. Pyrosis   3. Atypical chest pain     History of Present Illness: This is the patient's first visit to the outpatient [facility] clinic.  Beginning in early January the patient had the acute onset of mid epigastric discomfort with associated radiation of discomfort towards her mid chest and up towards her neck.  At times there was burning there was associated with this.  These episodes would last a few seconds to minutes and then went away on their own.  He eventually saw her primary care provider and was initiated on Pepcid.  She was still having symptoms she was followed up by her primary care doctor about a week later and started on omeprazole.  While on omeprazole she still had an uncomfortable feeling occurring in mid epigastric region.  She has no true dysphagia.  She is not had any.  There is no alleviation or aggravation of the pain with food passing.  She obviously describes no other history of having similar types of pain in the past.  She normally has a bowel movement once every day or every other day.  She denies any melena or hematochezia.  2 days before today's clinic visit she was evaluated for a different type of chest pain in the ED and was effectively ruled out for cardiac etiology.  She has not follow-up with her primary care provider since.  The ED physicians thought this was GERD.  She has never had an upper or lower endoscopy.  She  does not take significant nonsteroidals or BC/Goody powders.    GI Review of Systems Positive as above Negative for odynophagia, jaundice, change in bowel habits  Review of Systems General: Denies fevers/chills HEENT: Denies oral lesions/sore throat Cardiovascular: Denies current chest pain/palpitations Pulmonary: Denies shortness of breath/cough Gastroenterological: See HPI Genitourinary: Denies darkened urine Hematological: Denies easy bruising/bleeding Endocrine: Denies temperature intolerance Dermatological: Denies jaundice Psychological: Mood is stable   Medications Current Outpatient Medications  Medication Sig Dispense Refill  . famotidine (PEPCID) 20 MG tablet Take 1 tablet (20 mg total) by mouth daily. 30 tablet 0  . omeprazole (PRILOSEC) 20 MG capsule Take 1 capsule (20 mg total) by mouth daily. 30 capsule 3  . omeprazole (PRILOSEC) 40 MG capsule Take 1 capsule (40 mg total) by mouth 2 (two) times daily. 60 capsule 2   No current facility-administered medications for this visit.     Allergies Allergies  Allergen Reactions  . Codeine     Syncope   . Penicillins Hives    Has patient had a PCN reaction causing immediate rash, facial/tongue/throat swelling, SOB or lightheadedness with hypotension: Yes Has patient had a PCN reaction causing severe rash involving mucus membranes or skin necrosis: No Has patient had a PCN reaction that required hospitalization: No Has patient had a PCN reaction occurring within the last 10 years: No If all of the above answers are "NO", then may proceed with Cephalosporin use.     Histories Past Medical History:  Diagnosis Date  . Acid reflux    Past Surgical History:  Procedure Laterality Date  . CESAREAN SECTION  3x last being 01/12  . EYE SURGERY     lasik  . LAPAROSCOPIC APPENDECTOMY N/A 12/31/2017   Procedure: APPENDECTOMY LAPAROSCOPIC;  Surgeon: Franky Macho, MD;  Location: [facility];  Service: General;  Laterality: N/A;    Social History   Socioeconomic History  . Marital status: Married    Spouse name: Not on file  . Number of children: 3  . Years of education: Not on file  . Highest education level: Not on file  Occupational History    Employer: UNEMPLOYED  Social Needs  . Financial resource strain: Not on file  . Food insecurity:    Worry: Not on file    Inability: Not on file  . Transportation needs:    Medical: Not on file    Non-medical: Not on file  Tobacco Use  . Smoking status: Never Smoker  . Smokeless tobacco: Never Used  Substance and Sexual Activity  . Alcohol use: No  . Drug use: No  . Sexual activity: Yes    Birth control/protection: I.U.D.    Comment: IUD placed 01/2011  Lifestyle  . Physical activity:    Days per week: Not on file    Minutes per session: Not on file  . Stress: Not on file  Relationships  . Social connections:    Talks on phone: Not on file    Gets together: Not on file    Attends religious service: Not on file    Active member of club or organization: Not on file    Attends meetings of clubs or organizations: Not on file    Relationship status: Not on file  . Intimate partner violence:    Fear of current or ex partner: Not on file    Emotionally abused: Not on file    Physically abused: Not on file    Forced sexual activity: Not on file  Other Topics Concern  . 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   Married.         Family History  Problem Relation Age of Onset  . Diabetes Father   . Cancer Maternal Grandmother        lung cancer  . Colon cancer Neg Hx   . Esophageal cancer Neg Hx   . Inflammatory bowel disease Neg Hx   . Liver disease Neg Hx   . Pancreatic cancer Neg Hx   . Rectal cancer Neg Hx   . Stomach cancer Neg Hx    I have reviewed her medical, social, and family history in detail and updated the  electronic medical record as necessary.    PHYSICAL EXAMINATION  BP 100/70   Pulse 88   Ht 5\' 5"  (1.651 m)   Wt 134 lb (60.8 kg)   LMP 01/23/2019   BMI 22.30 kg/m  Wt Readings from Last 3 Encounters:  01/28/19 134 lb (60.8 kg)  01/26/19 138 lb 3.7 oz (62.7 kg)  01/05/19 138 lb 6.4 oz (62.8 kg)  GEN: NAD, appears stated age, doesn't appear chronically ill PSYCH: Cooperative, without pressured speech EYE: Conjunctivae pink, sclerae anicteric ENT: MMM, without oral ulcers, no erythema or exudates noted NECK: Supple CV: RR without R/Gs  RESP: CTAB posteriorly,  without wheezing GI: NABS, soft, minimal tenderness to palpation in the midepigastric region, ND, without rebound or guarding, no HSM appreciated MSK/EXT: No lower extremity edema SKIN: No jaundice NEURO:  Alert & Oriented x 3, no focal deficits   REVIEW OF DATA  I reviewed the following data at the time of this encounter:  GI Procedures and Studies  No relevant studies to review  Laboratory Studies  Reviewed in epic  Imaging Studies  January 2019 CT abdomen pelvis with contrast IMPRESSION: Borderline distended retrocecal appendix up to 7-8 mm in diameter with hyperenhancing mucosa and minimal right lower quadrant mesenteric inflammatory change. There appears to be a 2 mm calcification at the base of the appendix. Reactive mesenteric lymph nodes in the right lower quadrant are also present. Findings are suspicious for early changes of acute appendicitis, coronal series 5, image 34, series 2, images 40-47. Otherwise unremarkable CT of the abdomen and pelvis.  February 2020 CT chest with contrast IMPRESSION: No evidence of significant pulmonary embolus. No evidence of active pulmonary disease.   ASSESSMENT  Ms. Delmundo is a 38 y.o. female with a pmh significant for reported GERD status post appendectomy.  The patient is seen today for evaluation and management of:  1. Midepigastric pain   2. Pyrosis   3.  Atypical chest pain    This is a hemodynamically stable patient who presents for further evaluation of problems as noted above.  I think the patient likely has 2 separate processes going on.  Some of her symptoms do sound like typical GERD but I am not totally convinced.  We should proceed with a high-dose PPI trial to see how she does over the course of the next few weeks as well as obtain an abdominal ultrasound to evaluate any changes in her upper abdominal organs.  I suspect that will be normal.  If after few weeks of being on high-dose therapy she is still having issues we may have to consider an upper endoscopy.  I have asked the patient to also come in within 6 to 10 hours of a significant episode of discomfort to see if her hepatic function panel or pancreatic enzymes are elevated to ensure that we do not have anything else that would be suggestive of a biliary pathology.  Her discomfort does not sound like typical biliary colic.  The work-up is unremarkable then we will proceed with an upper endoscopy based on how she does with a PPI trial.  All patient questions were answered, to the best of my ability, and the patient agrees to the aforementioned plan of action with follow-up as indicated.   PLAN  Labs as outlined below to be drawn within 6 to 10 hours of significant episode of pain Abdominal ultrasound Omeprazole 40 twice daily to be initiated for next 6 to 8 weeks Follow-up thereafter and determine need for endoscopy with biopsies   Orders Placed This Encounter  Procedures  . US Abdomen Complete  . Amylase  . Lipase  . Hepatic function panel    New Prescriptions   OMEPRAZOLE (PRILOSEC) 40 MG CAPSULE    Take 1 capsule (40 mg total) by mouth 2 (two) times daily.   Modified Medications   No medications on file    Planned Follow Up: No follow-ups on file.   Corliss Parish, MD  Gastroenterology Advanced Endoscopy Office # [phone]

## 2019-01-28 NOTE — Patient Instructions (Signed)
If you are age 37 or older, your body mass index should be between 23-30. Your Body mass index is 22.3 kg/m. If this is out of the aforementioned range listed, please consider follow up with your Primary Care Provider.  If you are age 77 or younger, your body mass index should be between 19-25. Your Body mass index is 22.3 kg/m. If this is out of the aformentioned range listed, please consider follow up with your Primary Care Provider.    Your provider has requested that you go to the basement level for lab work in 3 months.  Press "B" on the elevator. The lab is located at the first door on the left as you exit the elevator.   You have been scheduled for an abdominal ultrasound at [facility] (1st floor of hospital) on 02/03/2019 at 10:15am. Please arrive 15 minutes prior to your appointment for registration. Make certain not to have anything to eat or drink 6 hours prior to your appointment. Should you need to reschedule your appointment, please contact radiology at [phone]. This test typically takes about 30 minutes to perform.  We have sent the following medications to your pharmacy for you to pick up at your convenience: Omeprazole

## 2019-01-30 ENCOUNTER — Encounter: Payer: Self-pay | Admitting: Gastroenterology

## 2019-01-30 DIAGNOSIS — R1013 Epigastric pain: Secondary | ICD-10-CM | POA: Insufficient documentation

## 2019-01-30 DIAGNOSIS — K219 Gastro-esophageal reflux disease without esophagitis: Secondary | ICD-10-CM | POA: Insufficient documentation

## 2019-01-30 DIAGNOSIS — R0789 Other chest pain: Secondary | ICD-10-CM | POA: Insufficient documentation

## 2019-02-03 ENCOUNTER — Ambulatory Visit (HOSPITAL_COMMUNITY)
Admission: RE | Admit: 2019-02-03 | Discharge: 2019-02-03 | Disposition: A | Discharge status: Home / Self Care | Payer: BLUE CROSS/BLUE SHIELD | Source: Ambulatory Visit | Attending: Gastroenterology | Admitting: Gastroenterology

## 2019-02-03 DIAGNOSIS — R12 Heartburn: Secondary | ICD-10-CM | POA: Insufficient documentation

## 2019-02-03 DIAGNOSIS — R1013 Epigastric pain: Secondary | ICD-10-CM | POA: Insufficient documentation

## 2019-03-09 ENCOUNTER — Encounter: Payer: Self-pay | Admitting: Gastroenterology

## 2019-03-09 ENCOUNTER — Ambulatory Visit (INDEPENDENT_AMBULATORY_CARE_PROVIDER_SITE_OTHER): Payer: BLUE CROSS/BLUE SHIELD | Admitting: Gastroenterology

## 2019-03-09 ENCOUNTER — Other Ambulatory Visit: Payer: Self-pay

## 2019-03-09 VITALS — BP 98/76 | HR 65 | Temp 98.2°F | Ht 64.0 in | Wt 134.0 lb

## 2019-03-09 DIAGNOSIS — R1013 Epigastric pain: Secondary | ICD-10-CM

## 2019-03-09 DIAGNOSIS — R12 Heartburn: Secondary | ICD-10-CM

## 2019-03-09 DIAGNOSIS — K219 Gastro-esophageal reflux disease without esophagitis: Secondary | ICD-10-CM

## 2019-03-09 MED ORDER — OMEPRAZOLE 40 MG PO CPDR: 40.0000 mg | DELAYED_RELEASE_CAPSULE | Freq: Every day | ORAL | 2 refills | Status: DC

## 2019-03-09 NOTE — Progress Notes (Signed)
GASTROENTEROLOGY OUTPATIENT CLINIC VISIT   Primary Care Provider Sandford Craze, NP [address] [place] [address] [phone]  Patient Profile: Casey Larson is a 38 y.o. female with a pmh significant for reported GERD status post appendectomy.  The patient presents to the [facility] for an evaluation and management of problem(s) noted below:  Problem List 1. Epigastric pain   2. Gastroesophageal reflux disease, esophagitis presence not specified   3. Pyrosis     History of Present Illness Please see initial consultation note for full details of HPI.    Interval History Today, the patient presents for scheduled outpatient follow-up.  She is doing very well and has not had any recurrence of symptoms.  She remains on omeprazole 40 mg twice daily.  She has not had any recurrent chest pain.  She has no heartburn symptoms currently.  She is very happy with the results and is hopeful that she will not need an upper endoscopy.  Her abdominal ultrasound had shown evidence of tumefactive sludge in the gallbladder but no biliary ductal dilation and she has normal liver tests.  She has not had any recurrence of pain requiring follow-up with a repeat labs that are pending as future orders.  GI Review of Systems Positive as above Negative for dysphagia, odynophagia, nausea, vomiting, abdominal pain, early satiety, melena, hematochezia  Review of Systems General: Denies fevers/chills HEENT: Denies oral lesions Cardiovascular: Denies current chest pain/palpitations Pulmonary: Denies shortness of breath/nocturnal cough Gastroenterological: See HPI Genitourinary: Denies darkened urine Hematological: Denies easy bruising Dermatological: Denies jaundice Psychological: Mood is stable   Medications Current Outpatient Medications  Medication Sig Dispense Refill   omeprazole (PRILOSEC) 40 MG capsule Take 1 capsule (40 mg total) by mouth  daily. 30 capsule 2   No current facility-administered medications for this visit.     Allergies Allergies  Allergen Reactions   Codeine     Syncope    Penicillins Hives    Has patient had a PCN reaction causing immediate rash, facial/tongue/throat swelling, SOB or lightheadedness with hypotension: Yes Has patient had a PCN reaction causing severe rash involving mucus membranes or skin necrosis: No Has patient had a PCN reaction that required hospitalization: No Has patient had a PCN reaction occurring within the last 10 years: No If all of the above answers are "NO", then may proceed with Cephalosporin use.     Histories Past Medical History:  Diagnosis Date   Acid reflux    Past Surgical History:  Procedure Laterality Date   CESAREAN SECTION  3x last being 01/12   EYE SURGERY     lasik   LAPAROSCOPIC APPENDECTOMY N/A 12/31/2017   Procedure: APPENDECTOMY LAPAROSCOPIC;  Surgeon: Franky Macho, MD;  Location: [facility];  Service: General;  Laterality: N/A;   Social History   Socioeconomic History   Marital status: Married    Spouse name: Not on file   Number of children: 3   Years of education: Not on file   Highest education level: Not on file  Occupational History    Employer: UNEMPLOYED  Social Needs   Financial resource strain: Not on file   Food insecurity:    Worry: Not on file    Inability: Not on file   Transportation needs:    Medical: Not on file    Non-medical: Not on file  Tobacco Use   Smoking status: Never Smoker   Smokeless tobacco: Never Used  Substance and Sexual Activity  Alcohol use: No   Drug use: No   Sexual activity: Yes    Birth control/protection: I.U.D.    Comment: IUD placed 01/2011  Lifestyle   Physical activity:    Days per week: Not on file    Minutes per session: Not on file   Stress: Not on file  Relationships   Social connections:    Talks on phone: Not on file    Gets together: Not on file    Attends  religious service: Not on file    Active member of club or organization: Not on file    Attends meetings of clubs or organizations: Not on file    Relationship status: Not on file   Intimate partner violence:    Fear of current or ex partner: Not on file    Emotionally abused: Not on file    Physically abused: Not on file    Forced sexual activity: Not on file  Other Topics Concern   Not on file  Social History Narrative   Regular exercise: no   Caffeine use: tea 3 to 4 x a week   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   Married.         Family History  Problem Relation Age of Onset   Diabetes Father    Cancer Maternal Grandmother        lung cancer   Colon cancer Neg Hx    Esophageal cancer Neg Hx    Inflammatory bowel disease Neg Hx    Liver disease Neg Hx    Pancreatic cancer Neg Hx    Rectal cancer Neg Hx    Stomach cancer Neg Hx    I have reviewed her medical, social, and family history in detail and updated the electronic medical record as necessary.    PHYSICAL EXAMINATION  BP 98/76 (BP Location: Left Arm, Patient Position: Sitting, Cuff Size: Normal)    Pulse 65    Temp 98.2 F (36.8 C)    Ht 5\' 4"  (1.626 m) Comment: height measured without shoes   Wt 134 lb (60.8 kg)    LMP 03/01/2019    BMI 23.00 kg/m  Wt Readings from Last 3 Encounters:  03/09/19 134 lb (60.8 kg)  01/28/19 134 lb (60.8 kg)  01/26/19 138 lb 3.7 oz (62.7 kg)  GEN: NAD, appears stated age, doesn't appear chronically ill, accompanied by husband PSYCH: Cooperative, without pressured speech EYE: Conjunctivae pink, sclerae anicteric ENT: MMM CV: RR without R/Gs  RESP: CTAB posteriorly, without wheezing GI: NABS, soft, nontender, nondistended, without rebound or guarding  MSK/EXT: No lower extremity edema SKIN: No jaundice NEURO:  Alert & Oriented x 3, no focal deficits   REVIEW OF DATA  I reviewed the following data at the time of this encounter:  GI  Procedures and Studies  No relevant studies to review  Laboratory Studies  Reviewed in epic  Imaging Studies  February 03, 2019 ultrasound abdomen IMPRESSION: Tumefactive sludge without acute abnormality.   ASSESSMENT  Casey Larson is a 38 y.o. female with a pmh significant for reported GERD status post appendectomy.  The patient is seen today for evaluation and management of:  1. Epigastric pain   2. Gastroesophageal reflux disease, esophagitis presence not specified   3. Pyrosis    The patient is hemodynamically and clinically stable.  She has done well with a high-dose PPI trial.  Entirely possible that this was a an episode of significant reflux  or gastritis.  Hopefully we will be able to down titrate her PPI therapy with out recurrence of symptoms.  Her abdominal ultrasound showed evidence of tumefactive sludge in the gallbladder but she does not have any evidence of abnormal liver tests and the discomfort was not completely suggestive of biliary pathology released from her previous discussions.  At this point we will have future order still pending should she have a significant episode or recurrence of discomfort such that we can see what her liver tests and pancreas enzymes do at the time of a episode of pain.  She will come in within 6 to 10 hours of a significant episode.  If we can continue her on high-dose PPI through the month of March and then begin transitioning her to once daily dosing of omeprazole hopefully that will allow her to maintain her current status without recurrence of symptoms.  We will plan to maintain this for the next 2 to 3 months at 40 mg once daily and then proceed with follow-up in clinic and decide on being able to bring her down to 20 mg once daily.  If her work-up is unremarkable we will proceed with an upper endoscopy as a diagnostic procedure. All patient questions were answered, to the best of my ability, and the patient agrees to the aforementioned plan of  action with follow-up as indicated.   PLAN  Continue omeprazole twice daily until the end of March and starting in April start 40 mg once daily Hepatic function panel/amylase/lipase to be drawn within 6 to 10 hours of a recurrent significant episode of pain Hold on diagnostic endoscopy for now   No orders of the defined types were placed in this encounter.   New Prescriptions   No medications on file   Modified Medications   Modified Medication Previous Medication   OMEPRAZOLE (PRILOSEC) 40 MG CAPSULE omeprazole (PRILOSEC) 40 MG capsule      Take 1 capsule (40 mg total) by mouth daily.    Take 1 capsule (40 mg total) by mouth 2 (two) times daily.    Planned Follow Up: No follow-ups on file.   Corliss Parish, MD [facility] Office # [phone]

## 2019-03-09 NOTE — Patient Instructions (Signed)
Continue your omeprazole twice daily thru the end of March and then starting April 1st go to once daily. Take 30-60 minutes before breakfast.    We have sent the following medications to your pharmacy for you to pick up at your convenience: Omeprazole   Follow up with [place] in 2-3 months.    I appreciate the opportunity to care for you.

## 2019-03-12 ENCOUNTER — Encounter: Payer: Self-pay | Admitting: Gastroenterology

## 2019-03-12 DIAGNOSIS — R12 Heartburn: Secondary | ICD-10-CM | POA: Insufficient documentation

## 2019-05-04 ENCOUNTER — Other Ambulatory Visit: Payer: Self-pay

## 2019-05-04 ENCOUNTER — Encounter (HOSPITAL_COMMUNITY): Payer: Self-pay | Admitting: Emergency Medicine

## 2019-05-04 ENCOUNTER — Emergency Department (HOSPITAL_COMMUNITY)
Admission: EM | Admit: 2019-05-04 | Discharge: 2019-05-04 | Disposition: A | Discharge status: Home / Self Care | Payer: BLUE CROSS/BLUE SHIELD | Attending: Emergency Medicine | Admitting: Emergency Medicine

## 2019-05-04 ENCOUNTER — Telehealth: Payer: Self-pay | Admitting: Gastroenterology

## 2019-05-04 DIAGNOSIS — R1013 Epigastric pain: Secondary | ICD-10-CM | POA: Insufficient documentation

## 2019-05-04 DIAGNOSIS — R1011 Right upper quadrant pain: Secondary | ICD-10-CM | POA: Insufficient documentation

## 2019-05-04 DIAGNOSIS — Z79899 Other long term (current) drug therapy: Secondary | ICD-10-CM | POA: Insufficient documentation

## 2019-05-04 LAB — CBC
HCT: 40.4 % (ref 36.0–46.0)
Hemoglobin: 13.8 g/dL (ref 12.0–15.0)
MCH: 30.7 pg (ref 26.0–34.0)
MCHC: 34.2 g/dL (ref 30.0–36.0)
MCV: 89.8 fL (ref 80.0–100.0)
Platelets: 254 10*3/uL (ref 150–400)
RBC: 4.5 MIL/uL (ref 3.87–5.11)
RDW: 12.7 % (ref 11.5–15.5)
WBC: 6.7 10*3/uL (ref 4.0–10.5)
nRBC: 0 % (ref 0.0–0.2)

## 2019-05-04 LAB — COMPREHENSIVE METABOLIC PANEL
ALT: 26 U/L (ref 0–44)
AST: 21 U/L (ref 15–41)
Albumin: 4.1 g/dL (ref 3.5–5.0)
Alkaline Phosphatase: 57 U/L (ref 38–126)
Anion gap: 8 (ref 5–15)
BUN: 12 mg/dL (ref 6–20)
CO2: 23 mmol/L (ref 22–32)
Calcium: 8.6 mg/dL — ABNORMAL LOW (ref 8.9–10.3)
Chloride: 104 mmol/L (ref 98–111)
Creatinine, Ser: 0.68 mg/dL (ref 0.44–1.00)
GFR calc Af Amer: >60 mL/min (ref >60)
GFR calc non Af Amer: >60 mL/min (ref >60)
Glucose, Bld: 119 mg/dL — ABNORMAL HIGH (ref 70–99)
Potassium: 3.7 mmol/L (ref 3.5–5.1)
Sodium: 135 mmol/L (ref 135–145)
Total Bilirubin: 0.5 mg/dL (ref 0.3–1.2)
Total Protein: 7.7 g/dL (ref 6.5–8.1)

## 2019-05-04 LAB — URINALYSIS, ROUTINE W REFLEX MICROSCOPIC
Bilirubin Urine: NEGATIVE
Glucose, UA: NEGATIVE mg/dL
Ketones, ur: NEGATIVE mg/dL
Leukocytes,Ua: NEGATIVE
Nitrite: NEGATIVE
Protein, ur: NEGATIVE mg/dL
Specific Gravity, Urine: 1.005 (ref 1.005–1.030)
pH: 8 (ref 5.0–8.0)

## 2019-05-04 LAB — LIPASE, BLOOD: Lipase: 34 U/L (ref 11–51)

## 2019-05-04 LAB — POC URINE PREG, ED: Preg Test, Ur: NEGATIVE

## 2019-05-04 MED ORDER — HYDROCODONE-ACETAMINOPHEN 5-325 MG PO TABS: 1.0000 | ORAL_TABLET | ORAL | 0 refills | Status: DC | PRN

## 2019-05-04 MED ORDER — ONDANSETRON 4 MG PO TBDP: 4.0000 mg | ORAL_TABLET | Freq: Three times a day (TID) | ORAL | 0 refills | Status: DC | PRN

## 2019-05-04 NOTE — Discharge Instructions (Signed)
RN will give you information regarding the ultrasound tomorrow.  Avoid rich greasy foods.  Medication for pain and nausea.  Phone number of general surgeon given.

## 2019-05-04 NOTE — ED Provider Notes (Signed)
[facility] EMERGENCY DEPARTMENT Provider Note   CSN: [id number] Arrival date & time: 05/04/19  1841    History   Chief Complaint Chief Complaint  Patient presents with  . Abdominal Pain    HPI Casey Larson is a 38 y.o. female.     Epigastric pain for several months with radiation to the back and shoulders, worse the past few days.  Right upper quadrant ultrasound on 02/03/2019 revealed sludge in the gallbladder.  No further follow-up at that time.  She has tried reflux meds with little success.  Symptoms worse with eating.  Surgical history appendectomy.  Severity is moderate.     Past Medical History:  Diagnosis Date  . Acid reflux     Patient Active Problem List   Diagnosis Date Noted  . Pyrosis 03/12/2019  . Gastroesophageal reflux disease 01/30/2019  . Epigastric pain 01/30/2019  . Atypical chest pain 01/30/2019  . Acute appendicitis 12/30/2017  . Appendicitis 12/30/2017  . Vasovagal near syncope 03/12/2014  . URI (upper respiratory infection) 10/02/2012  . Left breast mass 10/02/2012    Past Surgical History:  Procedure Laterality Date  . CESAREAN SECTION  3x last being 01/12  . EYE SURGERY     lasik  . LAPAROSCOPIC APPENDECTOMY N/A 12/31/2017   Procedure: APPENDECTOMY LAPAROSCOPIC;  Surgeon: Franky Macho, MD;  Location: [facility];  Service: General;  Laterality: N/A;     OB History   No obstetric history on file.      Home Medications    Prior to Admission medications   Medication Sig Start Date End Date Taking? Authorizing Provider  HYDROcodone-acetaminophen (NORCO/VICODIN) 5-325 MG tablet Take 1 tablet by mouth every 4 (four) hours as needed. One tab q4h prn pain 05/04/19   Donnetta Hutching, MD  omeprazole (PRILOSEC) 40 MG capsule Take 1 capsule (40 mg total) by mouth daily. 03/09/19   Mansouraty, Netty Starring., MD  ondansetron (ZOFRAN ODT) 4 MG disintegrating tablet Take 1 tablet (4 mg total) by mouth every 8 (eight) hours as needed for nausea or  vomiting. 05/04/19   Donnetta Hutching, MD    Family History Family History  Problem Relation Age of Onset  . Diabetes Father   . Cancer Maternal Grandmother        lung cancer  . Colon cancer Neg Hx   . Esophageal cancer Neg Hx   . Inflammatory bowel disease Neg Hx   . Liver disease Neg Hx   . Pancreatic cancer Neg Hx   . Rectal cancer Neg Hx   . Stomach cancer Neg Hx     Social History Social History   Tobacco Use  . Smoking status: Never Smoker  . Smokeless tobacco: Never Used  Substance Use Topics  . Alcohol use: No  . Drug use: No     Allergies   Codeine and Penicillins   Review of Systems Review of Systems  All other systems reviewed and are negative.    Physical Exam Updated Vital Signs BP 107/75   Ht 5\' 4"  (1.626 m)   Wt 61.2 kg   LMP 04/20/2019   BMI 23.17 kg/m   Physical Exam Vitals signs and nursing note reviewed.  Constitutional:      Appearance: She is well-developed.     Comments: nad  HENT:     Head: Normocephalic and atraumatic.  Eyes:     Conjunctiva/sclera: Conjunctivae normal.  Neck:     Musculoskeletal: Neck supple.  Cardiovascular:     Rate and Rhythm:  Normal rate and regular rhythm.  Pulmonary:     Effort: Pulmonary effort is normal.     Breath sounds: Normal breath sounds.  Abdominal:     General: Bowel sounds are normal.     Palpations: Abdomen is soft.     Comments: Tender epigastrium and right upper quadrant  Musculoskeletal: Normal range of motion.  Skin:    General: Skin is warm and dry.  Neurological:     Mental Status: She is alert and oriented to person, place, and time.  Psychiatric:        Behavior: Behavior normal.      ED Treatments / Results  Labs (all labs ordered are listed, but only abnormal results are displayed) Labs Reviewed  COMPREHENSIVE METABOLIC PANEL - Abnormal; Notable for the following components:      Result Value   Glucose, Bld 119 (*)    Calcium 8.6 (*)    All other components within  normal limits  URINALYSIS, ROUTINE W REFLEX MICROSCOPIC - Abnormal; Notable for the following components:   Color, Urine STRAW (*)    Hgb urine dipstick SMALL (*)    Bacteria, UA RARE (*)    All other components within normal limits  LIPASE, BLOOD  CBC  POC URINE PREG, ED    EKG None  Radiology No results found.  Procedures Procedures (including critical care time)  Medications Ordered in ED Medications - No data to display   Initial Impression / Assessment and Plan / ED Course  I have reviewed the triage vital signs and the nursing notes.  Pertinent labs & imaging results that were available during my care of the patient were reviewed by me and considered in my medical decision making (see chart for details).        Epigastric and right upper quadrant pain for several days.  Screening labs acceptable.  Will order repeat ultrasound for tomorrow.  Probable surgical consultation for gallbladder issues  Final Clinical Impressions(s) / ED Diagnoses   Final diagnoses:  Epigastric pain    ED Discharge Orders         Ordered    US Abdomen Limited RUQ/Gall Gladder     05/04/19 2043    ondansetron (ZOFRAN ODT) 4 MG disintegrating tablet  Every 8 hours PRN     05/04/19 2044    HYDROcodone-acetaminophen (NORCO/VICODIN) 5-325 MG tablet  Every 4 hours PRN     05/04/19 2044           Donnetta Hutchingook, Twain Stenseth, MD 05/04/19 2149

## 2019-05-04 NOTE — Telephone Encounter (Signed)
Pt's husband called to inform that pt's sxs are reoccurring again and would like some advise.

## 2019-05-04 NOTE — ED Triage Notes (Signed)
Pt c/o upper middle abd pain since Saturday night. Denies n/v/d.

## 2019-05-05 ENCOUNTER — Ambulatory Visit (INDEPENDENT_AMBULATORY_CARE_PROVIDER_SITE_OTHER): Payer: BLUE CROSS/BLUE SHIELD | Admitting: Medical

## 2019-05-05 ENCOUNTER — Encounter: Payer: Self-pay | Admitting: Medical

## 2019-05-05 ENCOUNTER — Ambulatory Visit (HOSPITAL_COMMUNITY)
Admission: RE | Admit: 2019-05-05 | Discharge: 2019-05-05 | Disposition: A | Discharge status: Home / Self Care | Payer: BLUE CROSS/BLUE SHIELD | Source: Ambulatory Visit | Attending: Emergency Medicine | Admitting: Emergency Medicine

## 2019-05-05 ENCOUNTER — Telehealth: Payer: Self-pay | Admitting: Gastroenterology

## 2019-05-05 VITALS — BP 107/75 | Ht 64.0 in

## 2019-05-05 DIAGNOSIS — R1013 Epigastric pain: Secondary | ICD-10-CM | POA: Diagnosis not present

## 2019-05-05 DIAGNOSIS — K219 Gastro-esophageal reflux disease without esophagitis: Secondary | ICD-10-CM | POA: Diagnosis not present

## 2019-05-05 DIAGNOSIS — R109 Unspecified abdominal pain: Secondary | ICD-10-CM

## 2019-05-05 DIAGNOSIS — R1011 Right upper quadrant pain: Secondary | ICD-10-CM | POA: Insufficient documentation

## 2019-05-05 MED ORDER — SUCRALFATE 1 G PO TABS: 1.0000 g | ORAL_TABLET | Freq: Four times a day (QID) | ORAL | 0 refills | Status: DC

## 2019-05-05 NOTE — Telephone Encounter (Signed)
I spoke to the pt and she went to the ED and has an [place] scheduled.  She will call back if she needs an appt with GI

## 2019-05-05 NOTE — Telephone Encounter (Signed)
Pt complains of upper abd pain that radiates to the left shoulder.  10/10 pain last night. Went to the ED 5/12 and had another [place] that was normal. Labs were normal and urine was normal.  HIDA scan was recommended.  Please advise

## 2019-05-05 NOTE — Progress Notes (Signed)
Subjective:    Patient ID: Casey Larson, female    DOB: 1981-05-19, 38 y.o.   MRN: [id number]  HPI   Virtual Visit via Video Note  I connected with Casey Larson on 05/05/19 at  2:20 PM EDT by a video enabled telemedicine application and verified that I am speaking with the correct person using two identifiers.  Location: Patient: home Provider: office   I discussed the limitations of evaluation and management by telemedicine and the availability of in person appointments. The patient expressed understanding and agreed to proceed.   I discussed the assessment and treatment plan with the patient. The patient was provided an opportunity to ask questions and all were answered. The patient agreed with the plan and demonstrated an understanding of the instructions.   The patient was advised to call back or seek an in-person evaluation if the symptoms worsen or if the condition fails to improve as anticipated.   hpi Pt in has some history of abdomen discomfort. See last visit last year and yesterday visit in ED.  Pt updates me that she gets abdomen pain that is moderate twice a week that responds to antacids often  She states Saturday moderate abdomen pain that was only for one day.   Pt states yesterday she had some severe pain after nap and went to ED. ED work up was negative on review. ED note chief complaint states chest pain but ros mentions abdomen pain. No ekg done. During interiew pt mentions no current pain. Work up negative in ED.  Pt has seen GI MD . Mansouraty in past. Pt states after in past omeprazole 40 mg twice a day did seem to help but just one tab did not help enough.  Pt states after leaving the ED abdomen pain resolved after Gi cocktail. Only mild pain today during the [place]. She only feels mild nausea. No chest pain.  Pt GI office did call her back today.  LMP- 2 weeks ago.  Paint that she has is random. But often noted when laying supine. If she  props herself up pain decreases.    Review of Systems  Constitutional: Negative for chills, fatigue and fever.  Respiratory: Negative for cough, choking, shortness of breath and wheezing.   Cardiovascular: Negative for chest pain and palpitations.  Gastrointestinal: Positive for abdominal pain and nausea. Negative for abdominal distention, blood in stool, diarrhea, rectal pain and vomiting.       Yesterday some abdomen pain.  Skin: Negative for rash.  Neurological: Negative for dizziness and headaches.  Hematological: Negative for adenopathy.  Psychiatric/Behavioral: Negative for behavioral problems, decreased concentration and dysphoric mood.        Objective:   Physical Exam General-no acute distress. Lungs-appear unlabored. Neuro-gross motor function intact. Abdomen- pt reports no pain today accept faint pain during [place].       Assessment & Plan:  You can have some recent symptoms that seem to be probable reflux related and might also be related to gallbladder dysfunction.  Work-up in the emergency department yesterday was negative and it appears that she did have clinical response to GI cocktail type medication.  I do want you to get back on omeprazole 40 mg twice a day and I prescribed you Carafate medication which can help as well.  In addition can take Zofran which the emergency department prescribe for nausea or vomiting.  Eat healthy bland diet.  I did go ahead and make referral to your gastroenterologist.  You already  called them and they called you back.  You might need the referral.  On that referral I did asked that she be seen later this week or early next week if possible.  If you do get appointment with them please send me my chart message notify me at that date.  If your signs and symptoms worsen again let me know as I can call specialist office and push for quick referral.  If you have any severe signs or symptoms change then recommend being seen again in the emergency  department.   Follow-up as regular scheduled with PCP or with me as needed.  Jacier Gladu, PA-C   25 minutes spent with pt 50% of time spent counseling on plan going forward.  Not I saw pt last year so not charging as new pt visit.

## 2019-05-05 NOTE — Patient Instructions (Signed)
You can have some recent symptoms that seem to be probable reflux related and might also be related to gallbladder dysfunction.  Work-up in the emergency department yesterday was negative and it appears that she did have clinical response to GI cocktail type medication.  I do want you to get back on omeprazole 40 mg twice a day and I prescribed you Carafate medication which can help as well.  In addition can take Zofran which the emergency department prescribe for nausea or vomiting.  Eat healthy bland diet.  I did go ahead and make referral to your gastroenterologist.  You already called them and they called you back.  You might need the referral.  On that referral I did asked that she be seen later this week or early next week if possible.  If you do get appointment with them please send me my chart message notify me at that date.  If your signs and symptoms worsen again let me know as I can call specialist office and push for quick referral.  If you have any severe signs or symptoms change then recommend being seen again in the emergency department.   Follow-up as regular scheduled with PCP or with me as needed.

## 2019-05-06 NOTE — Telephone Encounter (Signed)
The patient should be increased back to twice daily PPI. Let her know that her ultrasound that was done in the ED no longer showed biliary sludge. I would hold on a HIDA currently. Please schedule her for a diagnostic endoscopy with me at my next [facility] day. Thank you. GM

## 2019-05-07 ENCOUNTER — Encounter: Payer: Self-pay | Admitting: Medical

## 2019-05-07 NOTE — Telephone Encounter (Signed)
Pts husband states she has increased her PPI to bid. Pt scheduled for phone previsit 05/10/19@10am , EGD scheduled in the [facility] with Dr. Thea Silversmith 05/13/19@3 :30pm. Pts husband aware of appts.

## 2019-05-10 ENCOUNTER — Ambulatory Visit: Payer: BLUE CROSS/BLUE SHIELD

## 2019-05-10 ENCOUNTER — Encounter: Payer: Self-pay | Admitting: Gastroenterology

## 2019-05-10 ENCOUNTER — Other Ambulatory Visit: Payer: Self-pay

## 2019-05-10 VITALS — Ht 64.0 in | Wt 130.0 lb

## 2019-05-10 DIAGNOSIS — R1013 Epigastric pain: Secondary | ICD-10-CM

## 2019-05-10 NOTE — Progress Notes (Signed)
Per pt, no allergies to soy or egg products.Pt not taking any weight loss meds or using  O2 at home. Pt denies any sedation problems.   Pt refused emmi instructions.   The PV was done over the phone due to the COVID-19. I verfied the patients address and the insurance with her. I reviewed her medical history and prep instructions with her.  The pt will have her husband come to the 3rd floor today to pick up her instructions due to her appointment is Thursday, 05/13/19.    The pt was advised to call back if she has any questions or any changes prior to her procedure. She understood,

## 2019-05-11 ENCOUNTER — Telehealth: Payer: Self-pay

## 2019-05-11 NOTE — Telephone Encounter (Signed)
Covid-19 travel screening questions  Have you traveled in the last 14 days? No If yes where?  Do you now or have you had a fever in the last 14 days? No  Do you have any respiratory symptoms of shortness of breath or cough now or in the last 14 days? No  Do you have any family members or close contacts with diagnosed or suspected Covid-19? No       

## 2019-05-11 NOTE — Telephone Encounter (Signed)
Left message telling patient I would try back tomorrow concerning screening COVID questions.

## 2019-05-12 ENCOUNTER — Encounter (HOSPITAL_COMMUNITY): Payer: Self-pay | Admitting: Emergency Medicine

## 2019-05-12 ENCOUNTER — Other Ambulatory Visit: Payer: Self-pay | Admitting: Gastroenterology

## 2019-05-12 ENCOUNTER — Emergency Department (HOSPITAL_COMMUNITY)
Admission: EM | Admit: 2019-05-12 | Discharge: 2019-05-13 | Disposition: A | Discharge status: Home / Self Care | Payer: BLUE CROSS/BLUE SHIELD | Attending: Emergency Medicine | Admitting: Emergency Medicine

## 2019-05-12 ENCOUNTER — Telehealth: Payer: Self-pay | Admitting: *Deleted

## 2019-05-12 ENCOUNTER — Other Ambulatory Visit: Payer: Self-pay

## 2019-05-12 DIAGNOSIS — Z79899 Other long term (current) drug therapy: Secondary | ICD-10-CM | POA: Insufficient documentation

## 2019-05-12 DIAGNOSIS — R1010 Upper abdominal pain, unspecified: Secondary | ICD-10-CM | POA: Diagnosis not present

## 2019-05-12 DIAGNOSIS — R1013 Epigastric pain: Secondary | ICD-10-CM | POA: Diagnosis not present

## 2019-05-12 DIAGNOSIS — R109 Unspecified abdominal pain: Secondary | ICD-10-CM | POA: Diagnosis not present

## 2019-05-12 LAB — COMPREHENSIVE METABOLIC PANEL
ALT: 19 U/L (ref 0–44)
AST: 17 U/L (ref 15–41)
Albumin: 4.3 g/dL (ref 3.5–5.0)
Alkaline Phosphatase: 58 U/L (ref 38–126)
Anion gap: 9 (ref 5–15)
BUN: 11 mg/dL (ref 6–20)
CO2: 24 mmol/L (ref 22–32)
Calcium: 9 mg/dL (ref 8.9–10.3)
Chloride: 104 mmol/L (ref 98–111)
Creatinine, Ser: 0.65 mg/dL (ref 0.44–1.00)
GFR calc Af Amer: >60 mL/min (ref >60)
GFR calc non Af Amer: >60 mL/min (ref >60)
Glucose, Bld: 105 mg/dL — ABNORMAL HIGH (ref 70–99)
Potassium: 3.5 mmol/L (ref 3.5–5.1)
Sodium: 137 mmol/L (ref 135–145)
Total Bilirubin: 0.4 mg/dL (ref 0.3–1.2)
Total Protein: 7.8 g/dL (ref 6.5–8.1)

## 2019-05-12 LAB — CBC WITH DIFFERENTIAL/PLATELET
Abs Immature Granulocytes: 0.02 10*3/uL (ref 0.00–0.07)
Basophils Absolute: 0.1 10*3/uL (ref 0.0–0.1)
Basophils Relative: 1 %
Eosinophils Absolute: 0.2 10*3/uL (ref 0.0–0.5)
Eosinophils Relative: 2 %
HCT: 40.7 % (ref 36.0–46.0)
Hemoglobin: 13.8 g/dL (ref 12.0–15.0)
Immature Granulocytes: 0 %
Lymphocytes Relative: 34 %
Lymphs Abs: 3.1 10*3/uL (ref 0.7–4.0)
MCH: 30.2 pg (ref 26.0–34.0)
MCHC: 33.9 g/dL (ref 30.0–36.0)
MCV: 89.1 fL (ref 80.0–100.0)
Monocytes Absolute: 0.6 10*3/uL (ref 0.1–1.0)
Monocytes Relative: 6 %
Neutro Abs: 5.1 10*3/uL (ref 1.7–7.7)
Neutrophils Relative %: 57 %
Platelets: 302 10*3/uL (ref 150–400)
RBC: 4.57 MIL/uL (ref 3.87–5.11)
RDW: 12.9 % (ref 11.5–15.5)
WBC: 9 10*3/uL (ref 4.0–10.5)
nRBC: 0 % (ref 0.0–0.2)

## 2019-05-12 LAB — LIPASE, BLOOD: Lipase: 49 U/L (ref 11–51)

## 2019-05-12 MED ORDER — SODIUM CHLORIDE 0.9 % IV BOLUS
500.0000 mL | Freq: Once | INTRAVENOUS | Status: AC
  Administered 2019-05-12: 23:00:00 500 mL via INTRAVENOUS

## 2019-05-12 MED ORDER — ONDANSETRON HCL 4 MG/2ML IJ SOLN
4.0000 mg | Freq: Once | INTRAMUSCULAR | Status: AC
  Administered 2019-05-12: 23:00:00 4 mg via INTRAVENOUS
  Filled 2019-05-12: qty 2

## 2019-05-12 MED ORDER — FAMOTIDINE IN NACL 20-0.9 MG/50ML-% IV SOLN
20.0000 mg | Freq: Once | INTRAVENOUS | Status: AC
  Administered 2019-05-12: 23:00:00 20 mg via INTRAVENOUS
  Filled 2019-05-12: qty 50

## 2019-05-12 NOTE — Telephone Encounter (Signed)
Offered patient opportunity to move appointment to earlier in day. Pt states that she is unable to move the appointment from the currently scheduled time of 330.

## 2019-05-12 NOTE — ED Triage Notes (Signed)
Pt c/o of upper mid abd pain that has been going on since January. Has been seen several times for the same. Pt scheduled for a Endo scope tomorrow afternoon.

## 2019-05-13 ENCOUNTER — Encounter: Payer: Self-pay | Admitting: Gastroenterology

## 2019-05-13 ENCOUNTER — Ambulatory Visit (AMBULATORY_SURGERY_CENTER): Payer: BLUE CROSS/BLUE SHIELD | Admitting: Gastroenterology

## 2019-05-13 ENCOUNTER — Telehealth: Payer: Self-pay

## 2019-05-13 ENCOUNTER — Emergency Department (HOSPITAL_COMMUNITY): Payer: BLUE CROSS/BLUE SHIELD

## 2019-05-13 VITALS — BP 108/64 | HR 80 | Temp 98.3°F | Resp 10 | Ht 64.0 in | Wt 130.0 lb

## 2019-05-13 DIAGNOSIS — K219 Gastro-esophageal reflux disease without esophagitis: Secondary | ICD-10-CM | POA: Diagnosis not present

## 2019-05-13 DIAGNOSIS — Z79899 Other long term (current) drug therapy: Secondary | ICD-10-CM | POA: Diagnosis not present

## 2019-05-13 DIAGNOSIS — K3189 Other diseases of stomach and duodenum: Secondary | ICD-10-CM

## 2019-05-13 DIAGNOSIS — R109 Unspecified abdominal pain: Secondary | ICD-10-CM | POA: Diagnosis not present

## 2019-05-13 DIAGNOSIS — R1013 Epigastric pain: Secondary | ICD-10-CM

## 2019-05-13 DIAGNOSIS — K449 Diaphragmatic hernia without obstruction or gangrene: Secondary | ICD-10-CM

## 2019-05-13 DIAGNOSIS — K2980 Duodenitis without bleeding: Secondary | ICD-10-CM

## 2019-05-13 DIAGNOSIS — R1010 Upper abdominal pain, unspecified: Secondary | ICD-10-CM | POA: Diagnosis not present

## 2019-05-13 DIAGNOSIS — K208 Other esophagitis: Secondary | ICD-10-CM | POA: Diagnosis not present

## 2019-05-13 MED ORDER — SODIUM CHLORIDE 0.9 % IV SOLN: 500.0000 mL | Freq: Once | INTRAVENOUS | Status: DC

## 2019-05-13 NOTE — Telephone Encounter (Signed)
-----   Message from Lemar Lofty., MD sent at 05/13/2019  1:40 PM EDT ----- Regarding: Patient's next workup needs Casey Larson or Covering RN, Please see EGD report for full details. I would like to have a CTAP IV/PO contrast performed for further evaluation of her abdominal pain. I would also like to have a referral to [facility] for consideration of cholecystectomy for history of recurrent abdominal pain, history of tumefactive gallbladder sludge and symptoms. Please have CT scan performed prior to her Surgery visit. Thanks. GM

## 2019-05-13 NOTE — Patient Instructions (Addendum)
YOU HAD AN ENDOSCOPIC PROCEDURE TODAY AT THE [facility]:   Refer to the procedure report that was given to you for any specific questions about what was found during the examination.  If the procedure report does not answer your questions, please call your gastroenterologist to clarify.  If you requested that your care partner not be given the details of your procedure findings, then the procedure report has been included in a sealed envelope for you to review at your convenience later.  ** Handouts given on GERD, Hiatal hernia, as well as CT contrast and a CT scheduling sheet.**  YOU SHOULD EXPECT: Some feelings of bloating in the abdomen. Passage of more gas than usual.  Walking can help get rid of the air that was put into your GI tract during the procedure and reduce the bloating. If you had a lower endoscopy (such as a colonoscopy or flexible sigmoidoscopy) you may notice spotting of blood in your stool or on the toilet paper. If you underwent a bowel prep for your procedure, you may not have a normal bowel movement for a few days.  Please Note:  You might notice some irritation and congestion in your nose or some drainage.  This is from the oxygen used during your procedure.  There is no need for concern and it should clear up in a day or so.  SYMPTOMS TO REPORT IMMEDIATELY:     Following upper endoscopy (EGD)  Vomiting of blood or coffee ground material  New chest pain or pain under the shoulder blades  Painful or persistently difficult swallowing  New shortness of breath  Fever of 100F or higher  Black, tarry-looking stools  For urgent or emergent issues, a gastroenterologist can be reached at any hour by calling [phone].   DIET:  We do recommend a small meal at first, but then you may proceed to your regular diet.  Drink plenty of fluids but you should avoid alcoholic beverages for 24 hours.  ACTIVITY:  You should plan to take it easy for the rest of today and  you should NOT DRIVE or use heavy machinery until tomorrow (because of the sedation medicines used during the test).    FOLLOW UP: Our staff will call the number listed on your records 48-72 hours following your procedure to check on you and address any questions or concerns that you may have regarding the information given to you following your procedure. If we do not reach you, we will leave a message.  We will attempt to reach you two times.  During this call, we will ask if you have developed any symptoms of COVID 19. If you develop any symptoms (for example fever, flu-like symptoms, shortness of breath, cough etc.) before then, please call [phone].  If any biopsies were taken you will be contacted by phone or by letter within the next 1-3 weeks.  Please call us at [phone] if you have not heard about the biopsies in 3 weeks.    SIGNATURES/CONFIDENTIALITY: You and/or your care partner have signed paperwork which will be entered into your electronic medical record.  These signatures attest to the fact that that the information above on your After Visit Summary has been reviewed and is understood.  Full responsibility of the confidentiality of this discharge information lies with you and/or your care-partner.

## 2019-05-13 NOTE — ED Provider Notes (Signed)
[facility] EMERGENCY DEPARTMENT Provider Note   CSN: [id number] Arrival date & time: 05/12/19  2245    History   Chief Complaint Chief Complaint  Patient presents with  . Abdominal Pain    HPI Casey Larson is a 38 y.o. female.     The patient has a history of epigastric pain and is having endoscopy later today.  She presents with epigastric pain  The history is provided by the patient. No language interpreter was used.  Abdominal Pain  Pain location:  Epigastric Pain quality: aching   Pain radiates to:  Does not radiate Pain severity:  Moderate Onset quality:  Sudden Timing:  Constant Progression:  Worsening Chronicity:  Recurrent Context: not alcohol use   Associated symptoms: no chest pain, no cough, no diarrhea, no fatigue and no hematuria     Past Medical History:  Diagnosis Date  . Abdominal pain    epigastric pain to left side chest,back,arm  . Acid reflux   . Arthritis    as a child    Patient Active Problem List   Diagnosis Date Noted  . Pyrosis 03/12/2019  . Gastroesophageal reflux disease 01/30/2019  . Epigastric pain 01/30/2019  . Atypical chest pain 01/30/2019  . Acute appendicitis 12/30/2017  . Appendicitis 12/30/2017  . Vasovagal near syncope 03/12/2014  . URI (upper respiratory infection) 10/02/2012  . Left breast mass 10/02/2012    Past Surgical History:  Procedure Laterality Date  . APPENDECTOMY    . CESAREAN SECTION  3x last being 01/12  . EYE SURGERY     lasik  . LAPAROSCOPIC APPENDECTOMY N/A 12/31/2017   Procedure: APPENDECTOMY LAPAROSCOPIC;  Surgeon: Franky MachoJenkins, Mark, MD;  Location: [facility];  Service: General;  Laterality: N/A;     OB History   No obstetric history on file.      Home Medications    Prior to Admission medications   Medication Sig Start Date End Date Taking? Authorizing Provider  HYDROcodone-acetaminophen (NORCO/VICODIN) 5-325 MG tablet Take 1 tablet by mouth every 4 (four) hours as needed. One tab q4h  prn pain 05/04/19   Donnetta Hutchingook, Brian, MD  omeprazole (PRILOSEC) 40 MG capsule Take 1 capsule (40 mg total) by mouth daily. Patient taking differently: Take 40 mg by mouth 2 (two) times daily.  03/09/19   Mansouraty, Netty StarringGabriel Jr., MD  ondansetron (ZOFRAN ODT) 4 MG disintegrating tablet Take 1 tablet (4 mg total) by mouth every 8 (eight) hours as needed for nausea or vomiting. 05/04/19   Donnetta Hutchingook, Brian, MD  sucralfate (CARAFATE) 1 g tablet Take 1 tablet (1 g total) by mouth 4 (four) times daily. Patient taking differently: Take 1 g by mouth 2 (two) times daily.  05/05/19   Saguier, Ramon DredgeEdward, PA-C    Family History Family History  Problem Relation Age of Onset  . Diabetes Father   . Cancer Maternal Grandmother        lung cancer  . Colon cancer Neg Hx   . Esophageal cancer Neg Hx   . Inflammatory bowel disease Neg Hx   . Liver disease Neg Hx   . Pancreatic cancer Neg Hx   . Rectal cancer Neg Hx   . Stomach cancer Neg Hx     Social History Social History   Tobacco Use  . Smoking status: Never Smoker  . Smokeless tobacco: Never Used  Substance Use Topics  . Alcohol use: No  . Drug use: No     Allergies   Codeine and Penicillins  Review of Systems Review of Systems  Constitutional: Negative for appetite change and fatigue.  HENT: Negative for congestion, ear discharge and sinus pressure.   Eyes: Negative for discharge.  Respiratory: Negative for cough.   Cardiovascular: Negative for chest pain.  Gastrointestinal: Positive for abdominal pain. Negative for diarrhea.  Genitourinary: Negative for frequency and hematuria.  Musculoskeletal: Negative for back pain.  Skin: Negative for rash.  Neurological: Negative for seizures and headaches.  Psychiatric/Behavioral: Negative for hallucinations.     Physical Exam Updated Vital Signs BP 112/72   Pulse 78   Temp 98.4 F (36.9 C)   Resp 16   Ht 5\' 4"  (1.626 m)   Wt 61.2 kg   LMP 04/20/2019   SpO2 100%   BMI 23.17 kg/m   Physical  Exam Vitals signs and nursing note reviewed.  Constitutional:      Appearance: She is well-developed.  HENT:     Head: Normocephalic.     Nose: Nose normal.  Eyes:     General: No scleral icterus.    Conjunctiva/sclera: Conjunctivae normal.  Neck:     Musculoskeletal: Neck supple.     Thyroid: No thyromegaly.  Cardiovascular:     Rate and Rhythm: Normal rate and regular rhythm.     Heart sounds: No murmur. No friction rub. No gallop.   Pulmonary:     Breath sounds: No stridor. No wheezing or rales.  Chest:     Chest wall: No tenderness.  Abdominal:     General: There is no distension.     Tenderness: There is abdominal tenderness. There is no rebound.  Musculoskeletal: Normal range of motion.  Lymphadenopathy:     Cervical: No cervical adenopathy.  Skin:    Findings: No erythema or rash.  Neurological:     Mental Status: She is oriented to person, place, and time.     Motor: No abnormal muscle tone.     Coordination: Coordination normal.  Psychiatric:        Behavior: Behavior normal.      ED Treatments / Results  Labs (all labs ordered are listed, but only abnormal results are displayed) Labs Reviewed  COMPREHENSIVE METABOLIC PANEL - Abnormal; Notable for the following components:      Result Value   Glucose, Bld 105 (*)    All other components within normal limits  CBC WITH DIFFERENTIAL/PLATELET  LIPASE, BLOOD    EKG None  Radiology Dg Abd Acute 2+v W 1v Chest  Result Date: 05/13/2019 CLINICAL DATA:  38 year old female with progression of chronic abdominal pain. EXAM: DG ABDOMEN ACUTE W/ 1V CHEST COMPARISON:  Abdominal ultrasound 05/05/2019. CTA chest 01/26/2019. CT Abdomen and Pelvis 12/30/2017. FINDINGS: Lung volumes at the upper limits of normal. Normal cardiac size and mediastinal contours. Visualized tracheal air column is within normal limits. Both lungs appear clear. No pneumothorax or pneumoperitoneum. Upright and supine views of the abdomen. Non  obstructed bowel gas pattern. Abdominal and pelvic visceral contours appear normal. No osseous abnormality identified. IMPRESSION: 1. Normal bowel gas pattern, no free air. 2. Possible pulmonary hyperinflation. No acute cardiopulmonary abnormality. Electronically Signed   By: Odessa Fleming M.D.   On: 05/13/2019 00:40    Procedures Procedures (including critical care time)  Medications Ordered in ED Medications  famotidine (PEPCID) IVPB 20 mg premix (0 mg Intravenous Stopped 05/12/19 2352)  ondansetron (ZOFRAN) injection 4 mg (4 mg Intravenous Given 05/12/19 2319)  sodium chloride 0.9 % bolus 500 mL (0 mLs Intravenous Stopped 05/12/19 2352)  Initial Impression / Assessment and Plan / ED Course  I have reviewed the triage vital signs and the nursing notes.  Pertinent labs & imaging results that were available during my care of the patient were reviewed by me and considered in my medical decision making (see chart for details).        Labs unremarkable x-rays reviewed by me and are unremarkable.  Patient pain improved with Pepcid.  She will be discharged home and will follow-up later today for her endoscopy  Final Clinical Impressions(s) / ED Diagnoses   Final diagnoses:  Pain of upper abdomen    ED Discharge Orders    None       Bethann Berkshire, MD 05/13/19 0105

## 2019-05-13 NOTE — Progress Notes (Signed)
Called to room to assist during endoscopic procedure.  Patient ID and intended procedure confirmed with present staff. Received instructions for my participation in the procedure from the performing physician.  

## 2019-05-13 NOTE — Discharge Instructions (Signed)
Follow-up tomorrow as planned with me gastroenterologist

## 2019-05-13 NOTE — Op Note (Signed)
[facility] Patient Name: Casey Larson Procedure Date: 05/13/2019 1:08 PM MRN: [id number] Endoscopist: Justice Britain , MD Age: 38 Referring MD:  Date of Birth: November 16, 1981 Gender: Female Account #: [id number] Procedure:                Upper GI endoscopy Indications:              Epigastric abdominal pain, Dyspepsia, History of                            Tumefactive sludge in gallbladder with distention Medicines:                Monitored Anesthesia Care Procedure:                Pre-Anesthesia Assessment:                           - Prior to the procedure, a History and Physical                            was performed, and patient medications and                            allergies were reviewed. The patient's tolerance of                            previous anesthesia was also reviewed. The risks                            and benefits of the procedure and the sedation                            options and risks were discussed with the patient.                            All questions were answered, and informed consent                            was obtained. Prior Anticoagulants: The patient has                            taken no previous anticoagulant or antiplatelet                            agents. ASA Grade Assessment: I - A normal, healthy                            patient. After reviewing the risks and benefits,                            the patient was deemed in satisfactory condition to                            undergo the procedure.  After obtaining informed consent, the endoscope was                            passed under direct vision. Throughout the                            procedure, the patient's blood pressure, pulse, and                            oxygen saturations were monitored continuously. The                            Endoscope was introduced through the mouth, and                            advanced to  the second part of duodenum. The upper                            GI endoscopy was accomplished without difficulty.                            The patient tolerated the procedure. Scope In: Scope Out: Findings:                 No gross lesions were noted in the proximal                            esophagus and in the mid esophagus. Biopsies were                            taken with a cold forceps for histology to rule out                            EoE.                           A single island of salmon-colored mucosa was                            present from 33 to 34 cm. No other visible                            abnormalities were present. Biopsies were taken                            with a cold forceps for histology.                           A 2 cm hiatal hernia was found. The proximal extent                            of the gastric folds (end of tubular esophagus) was  35 cm from the incisors. The hiatal narrowing was                            37 cm from the incisors. The Z-line was irregular                            and found at 35 cm from the incisors.                           No gross mucosal lesions were noted in the entire                            examined stomach. Biopsies were taken with a cold                            forceps for histology and Helicobacter pylori                            testing.                           Patchy mildly erythematous mucosa without active                            bleeding was found in the duodenal bulb.                           No other gross mucosal lesions were noted in the                            second portion of the duodenum and in the area of                            the papilla.                           Biopsies were taken with a cold forceps in the                            duodenal bulb, in the first portion of the duodenum                            and in the second portion of  the duodenum for                            histology to rule out Enteropathy. Complications:            No immediate complications. Estimated Blood Loss:     Estimated blood loss was minimal. Impression:               - No gross lesions in proximal/middle esophagus.                            Biopsied.                           -  Salmon-colored mucosal island suspicious for                            Barrett's esophagus. Biopsied.                           - 2 cm hiatal hernia.                           - No gross lesions in the stomach. Biopsied for HP.                           - Erythematous duodenopathy in the bulb but no                            other gross lesions in the second portion of the                            duodenum and in the area of the papilla. Biopsies                            were taken with a cold forceps for histology in the                            duodenal bulb, in the first portion of the duodenum                            and in the second portion of the duodenum for                            enteropathy rule out. Recommendation:           - The patient will be observed post-procedure,                            until all discharge criteria are met.                           - Discharge patient to home.                           - Patient has a contact number available for                            emergencies. The signs and symptoms of potential                            delayed complications were discussed with the                            patient. Return to normal activities tomorrow.                            Written discharge instructions were provided to the  patient.                           - Advance diet as tolerated.                           - Observe patient's clinical course.                           - Await pathology results.                           - Maintain PPI BID for now.                            - Tylenol PRN Q6H (650 mg) may be used for pain                            control if necessary.                           - Will proceed with placing a surgical consultation                            to [facility] to evaluate for cholecystectomy                            in setting of recurrent epigastric pain, and wil                            findings recently of tumefactive sludge and                            distended gallbladder. Now with recurrent ED visits                            with pain and pain radiating into the shoulder                            blades. Query symptomatic gallbladder disease.                           - Will plan to proceed with ordering a                            CT-Abdomen/Pelvis with/without Contrast IV & PO.                           - The findings and recommendations were discussed                            with the patient. Justice Britain, MD 05/13/2019 1:39:52 PM

## 2019-05-13 NOTE — Progress Notes (Signed)
A/ox3, pleased with MAC, report to RN 

## 2019-05-14 NOTE — Telephone Encounter (Signed)
Will set up as requested will review procedure reports.

## 2019-05-15 ENCOUNTER — Telehealth: Payer: Self-pay

## 2019-05-15 NOTE — Telephone Encounter (Signed)
  Follow up Call-  Call back number 05/13/2019  Post procedure Call Back phone  # [phone]  Permission to leave phone message Yes  Some recent data might be hidden     Left message

## 2019-05-16 ENCOUNTER — Telehealth: Payer: Self-pay

## 2019-05-16 NOTE — Telephone Encounter (Signed)
  Follow up Call-  Call back number 05/13/2019  Post procedure Call Back phone  # [phone]  Permission to leave phone message Yes  Some recent data might be hidden     Left message for patient to call if he had any problems from procedure or developed and Covid-19 symptoms.

## 2019-05-18 ENCOUNTER — Encounter: Payer: Self-pay | Admitting: Gastroenterology

## 2019-05-19 ENCOUNTER — Telehealth: Payer: Self-pay

## 2019-05-19 ENCOUNTER — Other Ambulatory Visit: Payer: Self-pay

## 2019-05-19 DIAGNOSIS — R1084 Generalized abdominal pain: Secondary | ICD-10-CM

## 2019-05-19 NOTE — Telephone Encounter (Signed)
-----   Message from Lemar Lofty., MD sent at 05/18/2019  4:49 PM EDT ----- Regarding: Follow-up Patty or covering RN,This patient needs a CT scan could be performed in the coming weeks for further evaluation of her recurrent abdominal pain.  She also needs to have a [facility] [place] [facility] referral for concern of symptomatic gallbladder disease and biliary sludge.Please let me know if any further questions.Thank you.GM

## 2019-05-19 NOTE — Telephone Encounter (Signed)
Patient scheduled for CT of abd. with [place] CT 06/02/19 at 9:15am also sent referral to [facility]. Patient notified of both. Patient already given oral contrast

## 2019-05-26 ENCOUNTER — Encounter: Payer: Self-pay | Admitting: Medical

## 2019-05-27 ENCOUNTER — Telehealth: Payer: Self-pay | Admitting: Medical

## 2019-05-27 ENCOUNTER — Ambulatory Visit: Payer: Self-pay | Admitting: Surgery

## 2019-05-27 DIAGNOSIS — R1084 Generalized abdominal pain: Secondary | ICD-10-CM | POA: Diagnosis not present

## 2019-05-27 MED ORDER — OMEPRAZOLE 40 MG PO CPDR: DELAYED_RELEASE_CAPSULE | ORAL | 6 refills | Status: DC

## 2019-05-27 MED ORDER — OMEPRAZOLE 40 MG PO CPDR: 40.0000 mg | DELAYED_RELEASE_CAPSULE | Freq: Two times a day (BID) | ORAL | 6 refills | Status: DC

## 2019-05-27 NOTE — H&P (Signed)
Surgical H&P  CC: abdominal pain  HPI: 38yo woman referred for possible cholecystectomy by Dr. Meridee Score.  She initially presented to him with epigastric pain and GERD.  She has been seen in the emergency room 3 or 4 times for this in the last 6 months.  She has been started on omeprazole 40 mg twice a day and this seems to temporarily resolved her symptoms.  She did undergo an upper endoscopy a few weeks ago; findings included a 2 cm hiatal hernia, erythematous duodenoscopy in the bulb but no other gross lesions, and one small salmon colored mucosal patch biopsied was negative for Barrett's.   She did have an ultrasound in February that showed tumefactive sludge in the gallbladder but no biliary ductal dilatation.  Repeat ultrasound last month did not show any gallstones or sludge or gallbladder abnormalities.  The pain usually starts in the subxiphoid region, and radiates to various locations including her shoulders and her back.  She is unable to eat much of her normal diet as this seems to aggravate the pain, but it comes on somewhat randomly.  She has a CT scan pending for next week.  Labs may include CMP and CBC all of this was normal.  Medical history significant for GERD,  Surgical history: Laparoscopic appendectomy, C-section  Allergies  Allergen Reactions  . Codeine     Syncope   . Penicillins Hives    Has patient had a PCN reaction causing immediate rash, facial/tongue/throat swelling, SOB or lightheadedness with hypotension: Yes Has patient had a PCN reaction causing severe rash involving mucus membranes or skin necrosis: No Has patient had a PCN reaction that required hospitalization: No Has patient had a PCN reaction occurring within the last 10 years: No If all of the above answers are "NO", then may proceed with Cephalosporin use.  As a child!     Past Medical History:  Diagnosis Date  . Abdominal pain    epigastric pain to left side chest,back,arm  . Acid  reflux   . Arthritis    as a child    Past Surgical History:  Procedure Laterality Date  . APPENDECTOMY    . CESAREAN SECTION  3x last being 01/12  . EYE SURGERY     lasik  . LAPAROSCOPIC APPENDECTOMY N/A 12/31/2017   Procedure: APPENDECTOMY LAPAROSCOPIC;  Surgeon: Franky Macho, MD;  Location: [facility];  Service: General;  Laterality: N/A;    Family History  Problem Relation Age of Onset  . Diabetes Father   . Cancer Maternal Grandmother        lung cancer  . Colon cancer Neg Hx   . Esophageal cancer Neg Hx   . Inflammatory bowel disease Neg Hx   . Liver disease Neg Hx   . Pancreatic cancer Neg Hx   . Rectal cancer Neg Hx   . Stomach cancer Neg Hx     Social History   Socioeconomic History  . Marital status: Married    Spouse name: Not on file  . Number of children: 3  . Years of education: Not on file  . Highest education level: Not on file  Occupational History    Employer: UNEMPLOYED  Social Needs  . Financial resource strain: Not on file  . Food insecurity:    Worry: Not on file    Inability: Not on file  . Transportation needs:    Medical: Not on file    Non-medical: Not on file  Tobacco Use  . Smoking status:  Never Smoker  . Smokeless tobacco: Never Used  Substance and Sexual Activity  . Alcohol use: No  . Drug use: No  . Sexual activity: Yes    Birth control/protection: I.U.D.    Comment: IUD placed 01/2011  Lifestyle  . Physical activity:    Days per week: Not on file    Minutes per session: Not on file  . Stress: Not on file  Relationships  . Social connections:    Talks on phone: Not on file    Gets together: Not on file    Attends religious service: Not on file    Active member of club or organization: Not on file    Attends meetings of clubs or organizations: Not on file    Relationship status: Not on file  Other Topics Concern  . 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   Married.          Current Outpatient Medications on File Prior to Visit  Medication Sig Dispense Refill  . HYDROcodone-acetaminophen (NORCO/VICODIN) 5-325 MG tablet Take 1 tablet by mouth every 4 (four) hours as needed. One tab q4h prn pain (Patient not taking: Reported on 05/13/2019) 12 tablet 0  . omeprazole (PRILOSEC) 40 MG capsule 1 tab po bid 60 capsule 6  . ondansetron (ZOFRAN ODT) 4 MG disintegrating tablet Take 1 tablet (4 mg total) by mouth every 8 (eight) hours as needed for nausea or vomiting. 8 tablet 0  . sucralfate (CARAFATE) 1 g tablet Take 1 tablet (1 g total) by mouth 4 (four) times daily. (Patient taking differently: Take 1 g by mouth 2 (two) times daily. ) 160 tablet 0   No current facility-administered medications on file prior to visit.     Review of Systems: a complete, 10pt review of systems was completed with pertinent positives and negatives as documented in the HPI  General Present- Fatigue. Not Present- Appetite Loss, Chills, Fever, Night Sweats, Weight Gain and Weight Loss. Skin Present- Dryness. Not Present- Change in Wart/Mole, Hives, Jaundice, New Lesions, Non-Healing Wounds, Rash and Ulcer. Cardiovascular Present- Chest Pain, Palpitations and Rapid Heart Rate. Not Present- Difficulty Breathing Lying Down, Leg Cramps, Shortness of Breath and Swelling of Extremities. Gastrointestinal Present- Abdominal Pain, Excessive gas and Indigestion. Not Present- Bloating, Bloody Stool, Change in Bowel Habits, Chronic diarrhea, Constipation, Difficulty Swallowing, Gets full quickly at meals, Hemorrhoids, Nausea, Rectal Pain and Vomiting. Female Genitourinary Present- Urgency. Not Present- Frequency, Nocturia, Painful Urination and Pelvic Pain. Musculoskeletal Present- Back Pain, Joint Pain and Muscle Weakness. Not Present- Joint Stiffness, Muscle Pain and Swelling of Extremities. Hematology Present- Easy Bruising. Not  Present- Blood Thinners, Excessive bleeding, Gland problems, HIV and Persistent Infections.   Physical Exam:  05/27/2019 12:02 PM Weight: 128.4 lb Height: 64in Body Surface Area: 1.62 m Body Mass Index: 22.04 kg/m  Temp.: 97.31F(Temporal)  Pulse: 94 (Regular)  BP: 108/72 (Sitting, Left Arm, Standard)  Gen: alert and well appearing Eye: extraocular motion intact, no scleral icterus ENT: moist mucus membranes, dentition intact Neck: no mass or thyromegaly Chest: unlabored respirations, symmetrical air entry, clear bilaterally CV: regular rate and rhythm, no pedal edema Abdomen: soft, mildly tender in the epigastric and right subcostal as well as right periumbilical fields, nondistended. No mass or organomegaly MSK: strength symmetrical throughout, no deformity Neuro: grossly intact, normal gait  Psych: normal mood and affect, appropriate insight Skin: warm and dry, no rash or lesion on limited exam  CBC Latest Ref Rng & Units 05/12/2019 05/04/2019 01/26/2019  WBC 4.0 - 10.5 K/uL 9.0 6.7 10.7(H)  Hemoglobin 12.0 - 15.0 g/dL 69.613.8 29.513.8 28.413.6  Hematocrit 36.0 - 46.0 % 40.7 40.4 41.5  Platelets 150 - 400 K/uL 302 254 290    CMP Latest Ref Rng & Units 05/12/2019 05/04/2019 01/26/2019  Glucose 70 - 99 mg/dL 132(G105(H) 401(U119(H) 98  BUN 6 - 20 mg/dL 11 12 9   Creatinine 0.44 - 1.00 mg/dL 2.720.65 5.360.68 6.440.69  Sodium 135 - 145 mmol/L 137 135 137  Potassium 3.5 - 5.1 mmol/L 3.5 3.7 3.6  Chloride 98 - 111 mmol/L 104 104 106  CO2 22 - 32 mmol/L 24 23 23   Calcium 8.9 - 10.3 mg/dL 9.0 0.3(K8.6(L) 9.1  Total Protein 6.5 - 8.1 g/dL 7.8 7.7 7.9  Total Bilirubin 0.3 - 1.2 mg/dL 0.4 0.5 0.4  Alkaline Phos 38 - 126 U/L 58 57 48  AST 15 - 41 U/L 17 21 13(L)  ALT 0 - 44 U/L 19 26 10     No results found for: INR, PROTIME  Imaging: No results found.    A/P: ABDOMINAL PAIN (R10.9) Story: Her symptoms are consistent with biliary etiology. She does have a hiatal hernia but this is not likely to be  continuing to her pain although it certainly can be contributing to her reflux. We had a long discussion today. She has been through a fairly extensive workup and really cannot find any relief from her symptoms. I do recommend proceeding with laparoscopic cholecystectomy with possible cholangiogram. Discussed risks of surgery including bleeding, pain, scarring, intraabdominal injury specifically to the common bile duct and sequelae, conversion to open surgery, failure to resolve symptoms, blood clots/ pulmonary embolus, heart attack, pneumonia, stroke, death. Questions welcomed and answered to patient's satisfaction. She wishes to proceed with surgery.   Phylliss Blakeshelsea Gale Klar, MD [facility], [place] Pager [phone]

## 2019-05-27 NOTE — Telephone Encounter (Signed)
Rx omeprazole sent to pt pharmacy. 

## 2019-06-02 ENCOUNTER — Telehealth: Payer: Self-pay | Admitting: Gastroenterology

## 2019-06-02 ENCOUNTER — Ambulatory Visit (INDEPENDENT_AMBULATORY_CARE_PROVIDER_SITE_OTHER)
Admission: RE | Admit: 2019-06-02 | Discharge: 2019-06-02 | Disposition: A | Discharge status: Home / Self Care | Payer: BC Managed Care – PPO | Source: Ambulatory Visit | Attending: Gastroenterology | Admitting: Gastroenterology

## 2019-06-02 ENCOUNTER — Other Ambulatory Visit: Payer: Self-pay

## 2019-06-02 DIAGNOSIS — R1909 Other intra-abdominal and pelvic swelling, mass and lump: Secondary | ICD-10-CM | POA: Diagnosis not present

## 2019-06-02 DIAGNOSIS — R1084 Generalized abdominal pain: Secondary | ICD-10-CM | POA: Diagnosis not present

## 2019-06-02 DIAGNOSIS — R1013 Epigastric pain: Secondary | ICD-10-CM | POA: Diagnosis not present

## 2019-06-02 MED ORDER — IOHEXOL 300 MG/ML  SOLN
100.0000 mL | Freq: Once | INTRAMUSCULAR | Status: AC | PRN
  Administered 2019-06-02: 100 mL via INTRAVENOUS

## 2019-06-02 NOTE — Telephone Encounter (Signed)
CT report in epic. Call report regarding #1.  IMPRESSION: 1. There is a tubular fluid-filled structure arising in the left adnexa measuring 5.1 x 2.5 cm. This appearance is concerning for hydrosalpinx. No other pelvic mass evident. This finding may well warrant pelvic ultrasound to further evaluate.  2. Symmetric mild fullness of each renal collecting system without obstructing focus evident. Question relative stasis phenomenon due to mild distention of the urinary bladder. Note that the urinary bladder wall is of normal thickness. No renal or ureteral calculi on either side.  3. Mild dilatation of the cecum with fluid and air. Mild dilatation the rectum. There may be a degree of underlying ileus. No evident bowel obstruction.  4.  Appendix absent.  No periappendiceal region inflammation.  5.  Minimal ventral hernia containing only fat.

## 2019-06-03 ENCOUNTER — Other Ambulatory Visit: Payer: Self-pay | Admitting: Obstetrics

## 2019-06-03 NOTE — Telephone Encounter (Signed)
Called and spoke with patient on 06/02/2019 in the evening. She states she had a.  On the weekend of Memorial Day and does not feel that she could be pregnant at this point in time. She is never been told that she has any issues in the region of her ovaries or fallopian tubes or uterus. She has previously seen Dr. Valentino Saxon of [facility] obstetrics/gynecology but has not seen in many years. Discomfort that she has had has not really been in the pelvic region but with these findings does require further work-up/management. We also reviewed the other findings on the CT scan including the small ventral fat hernia. Vaughan Basta, can you please reach out to Dr. Earl Many office and let them know about the CT scan results and see if they want to schedule a transvaginal ultrasound in their office or if they would like [place] to move forward with ordering it ourselves? Once you hear back from them please reach out to the patient and let her know what they would like to do. Please keep me up-to-date as well. Thank you. GM

## 2019-06-03 NOTE — Telephone Encounter (Signed)
Thanks for the update. GM 

## 2019-06-03 NOTE — Telephone Encounter (Signed)
Spoke with Dr. Jerrilyn Cairo office and they are going to have the doctor review the CT and contact the pt regarding scheduling the [place]. Patient aware.

## 2019-06-16 ENCOUNTER — Telehealth (INDEPENDENT_AMBULATORY_CARE_PROVIDER_SITE_OTHER): Payer: BC Managed Care – PPO | Admitting: Family

## 2019-06-16 ENCOUNTER — Telehealth: Payer: Self-pay | Admitting: Medical

## 2019-06-16 ENCOUNTER — Other Ambulatory Visit: Payer: Self-pay

## 2019-06-16 DIAGNOSIS — K210 Gastro-esophageal reflux disease with esophagitis: Secondary | ICD-10-CM

## 2019-06-16 DIAGNOSIS — K439 Ventral hernia without obstruction or gangrene: Secondary | ICD-10-CM

## 2019-06-16 DIAGNOSIS — R9389 Abnormal findings on diagnostic imaging of other specified body structures: Secondary | ICD-10-CM | POA: Diagnosis not present

## 2019-06-16 DIAGNOSIS — R1013 Epigastric pain: Secondary | ICD-10-CM | POA: Diagnosis not present

## 2019-06-16 NOTE — Progress Notes (Signed)
Virtual Visit via Video Note  I connected with Casey Larson on 06/16/19 at 12:40 PM EDT by a video enabled telemedicine application and verified that I am speaking with the correct person using two identifiers.  Location: Patient: home Provider: home   I discussed the limitations of evaluation and management by telemedicine and the availability of in person appointments. The patient expressed understanding and agreed to proceed.  History of Present Illness:  Patient is a 38 yr old female who presents today for follow up of her recent GI symptoms (epigastric and ruq pain). She was last seen by Casey Larson on 05/05/19 with c/o abdominal pain. This visit was in follow up from her visit to the ED on 05/04/19 for same.  Workup included US which was negative.    She did see GI, Casey Larson who performed an EGD.  EGD noted 2 cm hiatal hernia and erythematous duodenopathy in the bulb. Some biopsies were taken. Biopsies noted changes consistent with gerd but no findings of malignancy.  She is maintained on omeprazole 40mg  bid. GI then ordered a CT of the abdomen/pelvis which noted normal appearing gallbladder, but incidental finding of a dilated tubular structure in the left adnexa measuring 5.1 x 2.5 cm (? Hydrosalpinx), mild symmetric fullness of each renal collecting system which was thought possibly due to stasis from urinary bladder distension, and minimal fat containing ventral hernia.   There was note of possible ileus at that time. Pt reported that she had just gotten over diarrhea at that time.   She reports that she has had no symptoms following her ED visit on 05/12/19. GI referred her to a general surgeon (Casey Larson) who has scheduled her for a laparoscopic cholecystectectomy in July.   Reports that she has been watching what she eats.   Past Medical History:  Diagnosis Date  . Abdominal pain    epigastric pain to left side chest,back,arm  . Acid reflux   . Arthritis    as a  child     Social History   Socioeconomic History  . Marital status: Married    Spouse name: Not on file  . Number of children: 3  . Years of education: Not on file  . Highest education level: Not on file  Occupational History    Employer: UNEMPLOYED  Social Needs  . Financial resource strain: Not on file  . Food insecurity    Worry: Not on file    Inability: Not on file  . Transportation needs    Medical: Not on file    Non-medical: Not on file  Tobacco Use  . Smoking status: Never Smoker  . Smokeless tobacco: Never Used  Substance and Sexual Activity  . Alcohol use: No  . Drug use: No  . Sexual activity: Yes    Birth control/protection: I.U.D.    Comment: IUD placed 01/2011  Lifestyle  . Physical activity    Days per week: Not on file    Minutes per session: Not on file  . Stress: Not on file  Relationships  . Social Musicianconnections    Talks on phone: Not on file    Gets together: Not on file    Attends religious service: Not on file    Active member of club or organization: Not on file    Attends meetings of clubs or organizations: Not on file    Relationship status: Not on file  . Intimate partner violence    Fear of current or ex  partner: Not on file    Emotionally abused: Not on file    Physically abused: Not on file    Forced sexual activity: Not on file  Other Topics Concern  . 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   Married.          Past Surgical History:  Procedure Laterality Date  . APPENDECTOMY    . CESAREAN SECTION  3x last being 01/12  . EYE SURGERY     lasik  . LAPAROSCOPIC APPENDECTOMY N/A 12/31/2017   Procedure: APPENDECTOMY LAPAROSCOPIC;  Surgeon: Aviva Signs, MD;  Location: [facility];  Service: General;  Laterality: N/A;    Family History  Problem Relation Age of Onset  . Diabetes Father   . Cancer Maternal  Grandmother        lung cancer  . Colon cancer Neg Hx   . Esophageal cancer Neg Hx   . Inflammatory bowel disease Neg Hx   . Liver disease Neg Hx   . Pancreatic cancer Neg Hx   . Rectal cancer Neg Hx   . Stomach cancer Neg Hx     Allergies  Allergen Reactions  . Codeine     Syncope   . Penicillins Hives    Has patient had a PCN reaction causing immediate rash, facial/tongue/throat swelling, SOB or lightheadedness with hypotension: Yes Has patient had a PCN reaction causing severe rash involving mucus membranes or skin necrosis: No Has patient had a PCN reaction that required hospitalization: No Has patient had a PCN reaction occurring within the last 10 years: No If all of the above answers are "NO", then may proceed with Cephalosporin use.  As a child!     Current Outpatient Medications on File Prior to Visit  Medication Sig Dispense Refill  . omeprazole (PRILOSEC) 40 MG capsule 1 tab po bid 60 capsule 6  . sucralfate (CARAFATE) 1 g tablet Take 1 tablet (1 g total) by mouth 4 (four) times daily. (Patient taking differently: Take 1 g by mouth 2 (two) times daily. ) 160 tablet 0   No current facility-administered medications on file prior to visit.     There were no vitals taken for this visit.   Observations/Objective:   Gen: Awake, alert, no acute distress Resp: Breathing is even and non-labored Psych: calm/pleasant demeanor Neuro: Alert and Oriented x 3, + facial symmetry, speech is clear.   Assessment and Plan:  Epigastric pain- ? Related to gallbladder dysfunction.  Defer management to general surgeon.  Abnormal finding on CT scan (?hydrosalpinx)- refer back to her GYN for further evaluation.  GERD- improved symptoms on PPI. Discussed gerd diet.  Ventral hernia- she is wondering if this should be surgically repaired.  I advised her to discuss this with her surgeon prior to her scheduled cholecystectomy.    Follow Up Instructions:    I discussed the  assessment and treatment plan with the patient. The patient was provided an opportunity to ask questions and all were answered. The patient agreed with the plan and demonstrated an understanding of the instructions.   The patient was advised to call back or seek an in-person evaluation if the symptoms worsen or if the condition fails to improve as anticipated.  Nance Pear, NP

## 2019-06-16 NOTE — Telephone Encounter (Signed)
Return in about 3 months (around 09/16/2019) for annual physical.   LEFT MSG FOR CALL BACK

## 2019-06-24 ENCOUNTER — Other Ambulatory Visit: Payer: Self-pay | Admitting: Obstetrics

## 2019-07-05 ENCOUNTER — Other Ambulatory Visit: Payer: Self-pay | Admitting: Obstetrics

## 2019-07-05 NOTE — Pre-Procedure Instructions (Signed)
ALEYZA SALMI  07/05/2019      [facility] [phone] - [place], [place] AT [address] [place] [address] Phone: [phone] Fax: [phone]    Your procedure is scheduled on Fri., July 09, 2019 from 9:00AM-11:00AM  Report to [facility] Entrance "A" at 7:00AM  Call this number if you have problems the morning of surgery:  [phone]   Remember:  Do not eat or drink after midnight on July 16th    Take these medicines the morning of surgery with A SIP OF WATER: Omeprazole (PRILOSEC)   If needed: Acetaminophen (TYLENOL)   As of today, stop taking all Aspirin (unless instructed by your doctor) and Other Aspirin containing products, Vitamins, Fish oils, and Herbal medications. Also stop all NSAIDS i.e. Advil, Ibuprofen, Motrin, Aleve, Anaprox, Naproxen, BC, Goody Powders, and all Supplements.   Special instructions:   [facility]- Preparing For Surgery  Before surgery, you can play an important role. Because skin is not sterile, your skin needs to be as free of germs as possible. You can reduce the number of germs on your skin by washing with CHG (chlorahexidine gluconate) Soap before surgery.  CHG is an antiseptic cleaner which kills germs and bonds with the skin to continue killing germs even after washing.    Please do not use if you have an allergy to CHG or antibacterial soaps. If your skin becomes reddened/irritated stop using the CHG.  Do not shave (including legs and underarms) for at least 48 hours prior to first CHG shower. It is OK to shave your face.  Please follow these instructions carefully.   1. Shower the NIGHT BEFORE SURGERY and the MORNING OF SURGERY with CHG.   2. If you chose to wash your hair, wash your hair first as usual with your normal shampoo.  3. After you shampoo, rinse your hair and body thoroughly to remove the shampoo.  4. Use CHG as you would any other liquid  soap. You can apply CHG directly to the skin and wash gently with a scrungie or a clean washcloth.   5. Apply the CHG Soap to your body ONLY FROM THE NECK DOWN.  Do not use on open wounds or open sores. Avoid contact with your eyes, ears, mouth and genitals (private parts). Wash Face and genitals (private parts)  with your normal soap.  6. Wash thoroughly, paying special attention to the area where your surgery will be performed.  7. Thoroughly rinse your body with warm water from the neck down.  8. DO NOT shower/wash with your normal soap after using and rinsing off the CHG Soap.  9. Pat yourself dry with a CLEAN TOWEL.  10. Wear CLEAN PAJAMAS to bed the night before surgery, wear comfortable clothes the morning of surgery  11. Place CLEAN SHEETS on your bed the night of your first shower and DO NOT SLEEP WITH PETS.   Day of Surgery:  Oral Hygiene is also important to reduce your risk of infection.  Remember - BRUSH YOUR TEETH THE MORNING OF SURGERY WITH YOUR REGULAR TOOTHPASTE   Do not wear jewelry, make-up or nail polish.  Do not wear lotions, powders, or perfumes, or deodorant.  Do not shave 48 hours prior to surgery.    Do not bring valuables to the hospital.  [facility] is not responsible for any belongings or valuables.  Contacts, dentures or bridgework may not be worn  into surgery.    For patients admitted to the hospital, discharge time will be determined by your treatment team.  Patients discharged the day of surgery will not be allowed to drive home.   Please wear clean clothes to the hospital/surgery center.    Please read over the following fact sheets that you were given. Pain Booklet, Coughing and Deep Breathing and Surgical Site Infection Prevention

## 2019-07-06 ENCOUNTER — Encounter (HOSPITAL_COMMUNITY)
Admission: RE | Admit: 2019-07-06 | Discharge: 2019-07-06 | Disposition: A | Discharge status: Home / Self Care | Payer: BC Managed Care – PPO | Source: Ambulatory Visit | Attending: Surgery | Admitting: Surgery

## 2019-07-06 ENCOUNTER — Other Ambulatory Visit (HOSPITAL_COMMUNITY)
Admission: RE | Admit: 2019-07-06 | Discharge: 2019-07-06 | Disposition: A | Discharge status: Home / Self Care | Payer: BC Managed Care – PPO | Source: Ambulatory Visit | Attending: Surgery | Admitting: Surgery

## 2019-07-06 ENCOUNTER — Other Ambulatory Visit: Payer: Self-pay

## 2019-07-06 ENCOUNTER — Encounter (HOSPITAL_COMMUNITY): Payer: Self-pay

## 2019-07-06 DIAGNOSIS — Z1159 Encounter for screening for other viral diseases: Secondary | ICD-10-CM | POA: Insufficient documentation

## 2019-07-06 DIAGNOSIS — Z01812 Encounter for preprocedural laboratory examination: Secondary | ICD-10-CM | POA: Diagnosis not present

## 2019-07-06 HISTORY — DX: Personal history of other diseases of the digestive system: Z87.19

## 2019-07-06 LAB — CBC
HCT: 42.8 % (ref 36.0–46.0)
Hemoglobin: 13.9 g/dL (ref 12.0–15.0)
MCH: 30.2 pg (ref 26.0–34.0)
MCHC: 32.5 g/dL (ref 30.0–36.0)
MCV: 93 fL (ref 80.0–100.0)
Platelets: 292 10*3/uL (ref 150–400)
RBC: 4.6 MIL/uL (ref 3.87–5.11)
RDW: 13.2 % (ref 11.5–15.5)
WBC: 5.8 10*3/uL (ref 4.0–10.5)
nRBC: 0 % (ref 0.0–0.2)

## 2019-07-06 LAB — SARS CORONAVIRUS 2 (TAT 6-24 HRS): SARS Coronavirus 2: NEGATIVE

## 2019-07-06 NOTE — Progress Notes (Signed)
PCP -  Mackie Pai, PA @ [place] Cardiologist - na  Chest x-ray -  2/20 EKG - 5/20 Stress Test - na ECHO - na Cardiac Cath - na  Sleep Study - na CPAP -   Fasting Blood Sugar - na Checks Blood Sugar _____ times a day  Blood Thinner Instructions: Aspirin Instructions:na  Anesthesia review:   Patient denies shortness of breath, fever, cough and chest pain at PAT appointment   Patient verbalized understanding of instructions that were given to them at the PAT appointment. Patient was also instructed that they will need to review over the PAT instructions again at home before surgery.

## 2019-07-08 DIAGNOSIS — Z118 Encounter for screening for other infectious and parasitic diseases: Secondary | ICD-10-CM | POA: Diagnosis not present

## 2019-07-08 DIAGNOSIS — Z113 Encounter for screening for infections with a predominantly sexual mode of transmission: Secondary | ICD-10-CM | POA: Diagnosis not present

## 2019-07-08 DIAGNOSIS — R9389 Abnormal findings on diagnostic imaging of other specified body structures: Secondary | ICD-10-CM | POA: Diagnosis not present

## 2019-07-08 MED ORDER — BUPIVACAINE LIPOSOME 1.3 % IJ SUSP
20.0000 mL | Freq: Once | INTRAMUSCULAR | Status: DC
  Filled 2019-07-08: qty 20

## 2019-07-09 ENCOUNTER — Ambulatory Visit (HOSPITAL_COMMUNITY): Payer: BC Managed Care – PPO | Admitting: Anesthesiology

## 2019-07-09 ENCOUNTER — Other Ambulatory Visit: Payer: Self-pay

## 2019-07-09 ENCOUNTER — Ambulatory Visit (HOSPITAL_COMMUNITY)
Admission: RE | Admit: 2019-07-09 | Discharge: 2019-07-09 | Disposition: A | Discharge status: Home / Self Care | Payer: BC Managed Care – PPO | Source: Ambulatory Visit | Attending: Surgery | Admitting: Surgery

## 2019-07-09 ENCOUNTER — Encounter (HOSPITAL_COMMUNITY)
Admission: RE | Disposition: A | Discharge status: Home / Self Care | Payer: Self-pay | Source: Ambulatory Visit | Attending: Surgery

## 2019-07-09 ENCOUNTER — Encounter (HOSPITAL_COMMUNITY): Payer: Self-pay

## 2019-07-09 DIAGNOSIS — K219 Gastro-esophageal reflux disease without esophagitis: Secondary | ICD-10-CM | POA: Diagnosis not present

## 2019-07-09 DIAGNOSIS — Z801 Family history of malignant neoplasm of trachea, bronchus and lung: Secondary | ICD-10-CM | POA: Diagnosis not present

## 2019-07-09 DIAGNOSIS — K429 Umbilical hernia without obstruction or gangrene: Secondary | ICD-10-CM | POA: Insufficient documentation

## 2019-07-09 DIAGNOSIS — Q438 Other specified congenital malformations of intestine: Secondary | ICD-10-CM | POA: Insufficient documentation

## 2019-07-09 DIAGNOSIS — K828 Other specified diseases of gallbladder: Secondary | ICD-10-CM | POA: Insufficient documentation

## 2019-07-09 DIAGNOSIS — Z833 Family history of diabetes mellitus: Secondary | ICD-10-CM | POA: Insufficient documentation

## 2019-07-09 DIAGNOSIS — Z885 Allergy status to narcotic agent status: Secondary | ICD-10-CM | POA: Diagnosis not present

## 2019-07-09 DIAGNOSIS — K449 Diaphragmatic hernia without obstruction or gangrene: Secondary | ICD-10-CM | POA: Diagnosis not present

## 2019-07-09 DIAGNOSIS — Z79899 Other long term (current) drug therapy: Secondary | ICD-10-CM | POA: Insufficient documentation

## 2019-07-09 HISTORY — PX: CHOLECYSTECTOMY: SHX55

## 2019-07-09 LAB — POCT PREGNANCY, URINE: Preg Test, Ur: NEGATIVE

## 2019-07-09 SURGERY — LAPAROSCOPIC CHOLECYSTECTOMY
Anesthesia: General | Site: Abdomen

## 2019-07-09 MED ORDER — SODIUM CHLORIDE 0.9% FLUSH: 3.0000 mL | Freq: Two times a day (BID) | INTRAVENOUS | Status: DC

## 2019-07-09 MED ORDER — OXYCODONE HCL 5 MG PO TABS
5.0000 mg | ORAL_TABLET | ORAL | Status: DC | PRN
  Administered 2019-07-09: 10 mg via ORAL

## 2019-07-09 MED ORDER — MIDAZOLAM HCL 2 MG/2ML IJ SOLN
INTRAMUSCULAR | Status: AC
  Filled 2019-07-09: qty 2

## 2019-07-09 MED ORDER — GABAPENTIN 300 MG PO CAPS
300.0000 mg | ORAL_CAPSULE | ORAL | Status: AC
  Administered 2019-07-09: 08:00:00 300 mg via ORAL
  Filled 2019-07-09: qty 1

## 2019-07-09 MED ORDER — CHLORHEXIDINE GLUCONATE 4 % EX LIQD: 60.0000 mL | Freq: Once | CUTANEOUS | Status: DC

## 2019-07-09 MED ORDER — ONDANSETRON HCL 4 MG/2ML IJ SOLN
INTRAMUSCULAR | Status: DC | PRN
  Administered 2019-07-09: 4 mg via INTRAVENOUS

## 2019-07-09 MED ORDER — DEXAMETHASONE SODIUM PHOSPHATE 10 MG/ML IJ SOLN
INTRAMUSCULAR | Status: AC
  Filled 2019-07-09: qty 1

## 2019-07-09 MED ORDER — FENTANYL CITRATE (PF) 250 MCG/5ML IJ SOLN
INTRAMUSCULAR | Status: DC | PRN
  Administered 2019-07-09 (×5): 50 ug via INTRAVENOUS

## 2019-07-09 MED ORDER — DEXAMETHASONE SODIUM PHOSPHATE 10 MG/ML IJ SOLN
INTRAMUSCULAR | Status: DC | PRN
  Administered 2019-07-09: 10 mg via INTRAVENOUS

## 2019-07-09 MED ORDER — BUPIVACAINE LIPOSOME 1.3 % IJ SUSP
INTRAMUSCULAR | Status: DC | PRN
  Administered 2019-07-09: 17.5 mL

## 2019-07-09 MED ORDER — LIDOCAINE 2% (20 MG/ML) 5 ML SYRINGE
INTRAMUSCULAR | Status: DC | PRN
  Administered 2019-07-09: 50 mg via INTRAVENOUS

## 2019-07-09 MED ORDER — FENTANYL CITRATE (PF) 100 MCG/2ML IJ SOLN: 25.0000 ug | INTRAMUSCULAR | Status: DC | PRN

## 2019-07-09 MED ORDER — FENTANYL CITRATE (PF) 250 MCG/5ML IJ SOLN
INTRAMUSCULAR | Status: AC
  Filled 2019-07-09: qty 5

## 2019-07-09 MED ORDER — VANCOMYCIN HCL IN DEXTROSE 1-5 GM/200ML-% IV SOLN
1000.0000 mg | INTRAVENOUS | Status: AC
  Administered 2019-07-09: 1000 mg via INTRAVENOUS
  Filled 2019-07-09: qty 200

## 2019-07-09 MED ORDER — DIPHENHYDRAMINE HCL 50 MG/ML IJ SOLN
INTRAMUSCULAR | Status: AC
  Filled 2019-07-09: qty 1

## 2019-07-09 MED ORDER — 0.9 % SODIUM CHLORIDE (POUR BTL) OPTIME
TOPICAL | Status: DC | PRN
  Administered 2019-07-09: 1000 mL

## 2019-07-09 MED ORDER — ACETAMINOPHEN 650 MG RE SUPP: 650.0000 mg | RECTAL | Status: DC | PRN

## 2019-07-09 MED ORDER — PROPOFOL 10 MG/ML IV BOLUS
INTRAVENOUS | Status: DC | PRN
  Administered 2019-07-09: 150 mg via INTRAVENOUS

## 2019-07-09 MED ORDER — ROCURONIUM BROMIDE 10 MG/ML (PF) SYRINGE
PREFILLED_SYRINGE | INTRAVENOUS | Status: DC | PRN
  Administered 2019-07-09: 50 mg via INTRAVENOUS

## 2019-07-09 MED ORDER — DOCUSATE SODIUM 100 MG PO CAPS: 100.0000 mg | ORAL_CAPSULE | Freq: Two times a day (BID) | ORAL | 0 refills | Status: AC

## 2019-07-09 MED ORDER — LIDOCAINE 2% (20 MG/ML) 5 ML SYRINGE
INTRAMUSCULAR | Status: AC
  Filled 2019-07-09: qty 5

## 2019-07-09 MED ORDER — ONDANSETRON HCL 4 MG/2ML IJ SOLN
INTRAMUSCULAR | Status: AC
  Filled 2019-07-09: qty 2

## 2019-07-09 MED ORDER — TRAMADOL HCL 50 MG PO TABS: 50.0000 mg | ORAL_TABLET | Freq: Four times a day (QID) | ORAL | 0 refills | Status: AC | PRN

## 2019-07-09 MED ORDER — MIDAZOLAM HCL 2 MG/2ML IJ SOLN
INTRAMUSCULAR | Status: DC | PRN
  Administered 2019-07-09: 2 mg via INTRAVENOUS

## 2019-07-09 MED ORDER — ACETAMINOPHEN 325 MG PO TABS: 650.0000 mg | ORAL_TABLET | ORAL | Status: DC | PRN

## 2019-07-09 MED ORDER — SODIUM CHLORIDE 0.9 % IR SOLN
Status: DC | PRN
  Administered 2019-07-09: 1000 mL

## 2019-07-09 MED ORDER — SODIUM CHLORIDE 0.9 % IV SOLN: 250.0000 mL | INTRAVENOUS | Status: DC | PRN

## 2019-07-09 MED ORDER — LACTATED RINGERS IV SOLN
INTRAVENOUS | Status: DC | PRN
  Administered 2019-07-09 (×2): via INTRAVENOUS

## 2019-07-09 MED ORDER — ACETAMINOPHEN 500 MG PO TABS
1000.0000 mg | ORAL_TABLET | ORAL | Status: AC
  Administered 2019-07-09: 1000 mg via ORAL
  Filled 2019-07-09: qty 2

## 2019-07-09 MED ORDER — DIPHENHYDRAMINE HCL 50 MG/ML IJ SOLN
6.2500 mg | Freq: Once | INTRAMUSCULAR | Status: AC
  Administered 2019-07-09: 6.5 mg via INTRAVENOUS
  Filled 2019-07-09: qty 0.13

## 2019-07-09 MED ORDER — OXYCODONE HCL 5 MG PO TABS
ORAL_TABLET | ORAL | Status: AC
  Filled 2019-07-09: qty 2

## 2019-07-09 MED ORDER — BUPIVACAINE-EPINEPHRINE (PF) 0.25% -1:200000 IJ SOLN
INTRAMUSCULAR | Status: AC
  Filled 2019-07-09: qty 30

## 2019-07-09 MED ORDER — BUPIVACAINE-EPINEPHRINE 0.25% -1:200000 IJ SOLN
INTRAMUSCULAR | Status: DC | PRN
  Administered 2019-07-09: 17.5 mL

## 2019-07-09 MED ORDER — ROCURONIUM BROMIDE 10 MG/ML (PF) SYRINGE
PREFILLED_SYRINGE | INTRAVENOUS | Status: AC
  Filled 2019-07-09: qty 10

## 2019-07-09 MED ORDER — SODIUM CHLORIDE 0.9% FLUSH: 3.0000 mL | INTRAVENOUS | Status: DC | PRN

## 2019-07-09 MED ORDER — INDOCYANINE GREEN 25 MG IV SOLR: 2.5000 mg | Freq: Once | INTRAVENOUS | Status: DC

## 2019-07-09 MED ORDER — DIPHENHYDRAMINE HCL 50 MG/ML IJ SOLN
INTRAMUSCULAR | Status: DC | PRN
  Administered 2019-07-09: 6.25 mg via INTRAVENOUS

## 2019-07-09 MED ORDER — SUGAMMADEX SODIUM 200 MG/2ML IV SOLN
INTRAVENOUS | Status: DC | PRN
  Administered 2019-07-09: 200 mg via INTRAVENOUS

## 2019-07-09 SURGICAL SUPPLY — 35 items
APPLIER CLIP 5 13 M/L LIGAMAX5 (MISCELLANEOUS) ×3
BLADE CLIPPER SURG (BLADE) IMPLANT
CANISTER SUCT 3000ML PPV (MISCELLANEOUS) ×3 IMPLANT
CHLORAPREP W/TINT 26 (MISCELLANEOUS) ×3 IMPLANT
CLIP APPLIE 5 13 M/L LIGAMAX5 (MISCELLANEOUS) ×1 IMPLANT
COVER SURGICAL LIGHT HANDLE (MISCELLANEOUS) ×3 IMPLANT
COVER WAND RF STERILE (DRAPES) ×3 IMPLANT
DERMABOND ADVANCED (GAUZE/BANDAGES/DRESSINGS) ×2
DERMABOND ADVANCED .7 DNX12 (GAUZE/BANDAGES/DRESSINGS) ×1 IMPLANT
ELECT REM PT RETURN 9FT ADLT (ELECTROSURGICAL) ×3
ELECTRODE REM PT RTRN 9FT ADLT (ELECTROSURGICAL) ×1 IMPLANT
GLOVE BIO SURGEON STRL SZ 6 (GLOVE) ×3 IMPLANT
GLOVE INDICATOR 6.5 STRL GRN (GLOVE) ×3 IMPLANT
GOWN STRL REUS W/ TWL LRG LVL3 (GOWN DISPOSABLE) ×3 IMPLANT
GOWN STRL REUS W/TWL LRG LVL3 (GOWN DISPOSABLE) ×6
GRASPER SUT TROCAR 14GX15 (MISCELLANEOUS) ×3 IMPLANT
KIT BASIN OR (CUSTOM PROCEDURE TRAY) ×3 IMPLANT
KIT TURNOVER KIT B (KITS) ×3 IMPLANT
NDL INSUFFLATION 14GA 120MM (NEEDLE) ×1 IMPLANT
NEEDLE INSUFFLATION 14GA 120MM (NEEDLE) ×3 IMPLANT
NS IRRIG 1000ML POUR BTL (IV SOLUTION) ×3 IMPLANT
PAD ARMBOARD 7.5X6 YLW CONV (MISCELLANEOUS) ×3 IMPLANT
POUCH SPECIMEN RETRIEVAL 10MM (ENDOMECHANICALS) ×3 IMPLANT
SCISSORS LAP 5X35 DISP (ENDOMECHANICALS) ×3 IMPLANT
SET IRRIG TUBING LAPAROSCOPIC (IRRIGATION / IRRIGATOR) ×3 IMPLANT
SET TUBE SMOKE EVAC HIGH FLOW (TUBING) ×3 IMPLANT
SLEEVE ENDOPATH XCEL 5M (ENDOMECHANICALS) ×6 IMPLANT
SPECIMEN JAR SMALL (MISCELLANEOUS) ×3 IMPLANT
SUT MNCRL AB 4-0 PS2 18 (SUTURE) ×3 IMPLANT
TOWEL GREEN STERILE (TOWEL DISPOSABLE) IMPLANT
TOWEL GREEN STERILE FF (TOWEL DISPOSABLE) ×3 IMPLANT
TRAY LAPAROSCOPIC MC (CUSTOM PROCEDURE TRAY) ×3 IMPLANT
TROCAR XCEL NON-BLD 11X100MML (ENDOMECHANICALS) ×3 IMPLANT
TROCAR XCEL NON-BLD 5MMX100MML (ENDOMECHANICALS) ×3 IMPLANT
WATER STERILE IRR 1000ML POUR (IV SOLUTION) ×3 IMPLANT

## 2019-07-09 NOTE — Discharge Instructions (Signed)
LAPAROSCOPIC SURGERY: POST OP INSTRUCTIONS  ######################################################################  EAT Gradually transition to a high fiber diet with a fiber supplement over the next few weeks after discharge.  Start with a pureed / full liquid diet (see below)  WALK Walk an hour a day.  Control your pain to do that.    CONTROL PAIN Control pain so that you can walk, sleep, tolerate sneezing/coughing, go up/down stairs.  HAVE A BOWEL MOVEMENT DAILY Keep your bowels regular to avoid problems.  OK to try a laxative to override constipation.  OK to use an antidairrheal to slow down diarrhea.  Call if not better after 2 tries  CALL IF YOU HAVE PROBLEMS/CONCERNS Call if you are still struggling despite following these instructions. Call if you have concerns not answered by these instructions  ######################################################################    1. DIET: Follow a light bland diet & liquids the first 24 hours after arrival home, such as soup, liquids, starches, etc.  Be sure to drink plenty of fluids.  Quickly advance to a usual solid diet within a few days.  Avoid fast food or heavy meals as your are more likely to get nauseated or have irregular bowels.  A low-fat, high-fiber diet for the rest of your life is ideal.  2. Take your usually prescribed home medications unless otherwise directed.  3. PAIN CONTROL: a. Pain is best controlled by a usual combination of three different methods TOGETHER: i. Ice/Heat ii. Over the counter pain medication iii. Prescription pain medication b. Most patients will experience some swelling and bruising around the incisions.  Ice packs or heating pads (30-60 minutes up to 6 times a day) will help. Use ice for the first few days to help decrease swelling and bruising, then switch to heat to help relax tight/sore spots and speed recovery.  Some people prefer to use ice alone, heat alone, alternating between ice & heat.   Experiment to what works for you.  Swelling and bruising can take several weeks to resolve.   c. It is helpful to take an over-the-counter pain medication regularly for the first few weeks.  Choose one of the following that works best for you: i. Naproxen (Aleve, etc)  Two 220mg  tabs twice a day ii. Ibuprofen (Advil, etc) Three 200mg  tabs four times a day (every meal & bedtime) iii. Acetaminophen (Tylenol, etc) 500-650mg  four times a day (every meal & bedtime) d. A  prescription for pain medication (such as oxycodone, hydrocodone, tramadol, gabapentin, methocarbamol, etc) should be given to you upon discharge.  Take your pain medication as prescribed, if needed.  i. If you are having problems/concerns with the prescription medicine (does not control pain, nausea, vomiting, rash, itching, etc), please call us [phone] to see if we need to switch you to a different pain medicine that will work better for you and/or control your side effect better. ii. If you need a refill on your pain medication, please give [place] 48 hour notice.  contact your pharmacy.  They will contact our office to request authorization. Prescriptions will not be filled after 5 pm or on week-ends  4. Avoid getting constipated.   a. Between the surgery and the pain medications, it is common to experience some constipation.   b. Increasing fluid intake and taking a fiber supplement (such as Metamucil, Citrucel, FiberCon, MiraLax, etc) 1-2 times a day regularly will usually help prevent this problem from occurring.   c. A mild laxative (prune juice, Milk of Magnesia, MiraLax, etc) should be taken  according to package directions if there are no bowel movements after 48 hours.   5. Watch out for diarrhea.   a. If you have many loose bowel movements, simplify your diet to bland foods & liquids for a few days.   b. Stop any stool softeners and decrease your fiber supplement.   c. Switching to mild anti-diarrheal medications  (Kayopectate, Pepto Bismol) can help.   d. If this worsens or does not improve, please call us.  6. Wash / shower every day.  You may shower over the skin glue which is waterproof.  Continue to shower over incision(s) after the dressing is off.  7. Glue will flake off after about 2 weeks.  You may leave the incision open to air.  You may replace a dressing/Band-Aid to cover the incision for comfort if you wish.   8. ACTIVITIES as tolerated:   a. You may resume regular (light) daily activities beginning the next day--such as daily self-care, walking, climbing stairs--gradually increasing activities as tolerated.  If you can walk 30 minutes without difficulty, it is safe to try more intense activity such as jogging, treadmill, bicycling, low-impact aerobics, swimming, etc. b. Save the most intensive and strenuous activity for last such as sit-ups, heavy lifting, contact sports, etc  Refrain from any heavy lifting or straining until you are off narcotics for pain control.   c. DO NOT PUSH THROUGH PAIN.  Let pain be your guide: If it hurts to do something, don't do it.  Pain is your body warning you to avoid that activity for another week until the pain goes down. d. You may drive when you are no longer taking prescription pain medication, you can comfortably wear a seatbelt, and you can safely maneuver your car and apply brakes. e. Dennis Bast may have sexual intercourse when it is comfortable.  9. FOLLOW UP in our office a. Please call [facility] at [phone] to set up an appointment to see your surgeon in the office for a follow-up appointment approximately 2-3 weeks after your surgery. b. Make sure that you call for this appointment the day you arrive home to insure a convenient appointment time.  10. IF YOU HAVE DISABILITY OR FAMILY LEAVE FORMS, BRING THEM TO THE OFFICE FOR PROCESSING.  DO NOT GIVE THEM TO YOUR DOCTOR.   WHEN TO CALL us [phone]: 1. Poor pain control 2. Reactions / problems with  new medications (rash/itching, nausea, etc)  3. Fever over 101.5 F (38.5 C) 4. Inability to urinate 5. Nausea and/or vomiting 6. Worsening swelling or bruising 7. Continued bleeding from incision. 8. Increased pain, redness, or drainage from the incision   The clinic staff is available to answer your questions during regular business hours (8:30am-5pm).  Please dont hesitate to call and ask to speak to one of our nurses for clinical concerns.   If you have a medical emergency, go to the nearest emergency room or call 911.  A surgeon from [facility] is always on call at the [facility], [place], [place], [place], [place]  [address] ? MAIN: [phone] ? TOLL FREE: [phone] ?  FAX [phone] [email or web address]

## 2019-07-09 NOTE — Op Note (Signed)
Operative Note  Casey Larson 38 y.o. female [id number]  07/09/2019  Surgeon: Clovis Riley MD FACS  Assistant: OR staff  Procedure performed: Laparoscopic Cholecystectomy, diagnostic laparoscopy, primary repair of umbilical hernia  Preop diagnosis: biliary colic/ gallbladder sludge. Incidentally noted tiny umbilical hernia on CT scan. Incidentally noted left adnexal tubular structure on CT scan, not seen on transvaginal US.  Post-op diagnosis/intraop findings: redundant colon with large stool burden. No abnormalities in the pelvis.   Specimens: gallbladder  Retained items: none  EBL: minimal  Complications: none  Description of procedure: After obtaining informed consent the patient was brought to the operating room. Antibiotics were administered. SCD's were applied. General endotracheal anesthesia was initiated and a formal time-out was performed. The abdomen was prepped and draped in the usual sterile fashion and the abdomen was entered using visiport technique in the left upper quadrant after instilling the site with local. Insufflation to 66mmHg was obtained and gross inspection revealed no evidence of injury or bleeding from our entry.    The abdomen was surveyed, she has a very large redundant colon filled with stool, but the liver, stomach, and small bowel appeared within normal limits.  The hernia defect noted on CT scan was not correlated with any peritoneal defect or extrusion. Bladder was full.  Uterus appeared normal.  There is a very small amount of blood-tinged fluid in the pelvis.Two 85mm trocars were introduced in the right midclavicular and right anterior axillary lines under direct visualization and following infiltration with local.  An infraumbilical incision was made in the 11 mm trocar inserted through the small fascial defect at the umbilicus.   The patient was placed in steep Trendelenburg and the pelvic structures inspected.  There was no evidence of a mass or  cystic structure although visualization was somewhat limited by her redundant and full colon as well as her full bladder.  Bilateral fallopian tubes and ovaries were inspected and photos were taken.  The table was then flattened and the omentum and transverse colon were inspected and then lifted from the small bowel which was inspected, there is no evidence of bowel abnormality/ injury, bleeding, or mesenteric injury specifically in the left upper quadrant where the initial trocar had been placed.  At this point the patient was then placed in reverse Trendelenburg and rotated slightly to the left. The gallbladder was retracted cephalad and the infundibulum was retracted laterally. A combination of hook electrocautery and blunt dissection was utilized to clear the peritoneum from the neck and cystic duct, circumferentially isolating the cystic artery and cystic duct and lifting the gallbladder well away from the cystic plate.  Lymphatic was noted coursing along the cystic duct into the lymph node-this was clipped and divided to better skeletonized the cystic duct. The critical view of safety was achieved with the cystic artery, cystic duct, and liver bed visualized between them with no other structures. The artery was clipped with a single clip proximally and distally and divided as was the cystic duct with three clips on the proximal end.  The gallbladder was dissected from the liver plate using electrocautery. Once freed the gallbladder was placed in an endocatch bag and removed intact through the umbilical trocar site.  There was no bleeding on the liver bed, no bile had been spilled.  Hemostasis was once again confirmed, and reinspection of the abdomen revealed no injuries. The clips were well opposed without any bile leak from the duct or the liver bed.  The 11 mm trocar  was removed from the umbilical site and the fascia was closed vertically with 2 interrupted sutures of 0 Vicryl to repair the small umbilical  hernia defect noted on CT.  More local was infiltrated around each of the trochar sites.  The abdomen was once more surveyed and hemostasis confirmed. The abdomen was desufflated and all trocars removed. The skin incisions were closed with subcuticular monocryl and Dermabond. The patient was awakened, extubated and transported to the recovery room in stable condition.    All counts were correct at the completion of the case.

## 2019-07-09 NOTE — H&P (Signed)
Surgical H&P  CC: abdominal pain  HPI: 38yo woman referred for possible cholecystectomy by Dr. Meridee ScoreMansouraty.  She initially presented to him with epigastric pain and GERD.  She has been seen in the emergency room 3 or 4 times for this in the last 6 months.  She has been started on omeprazole 40 mg twice a day and this seems to temporarily resolved her symptoms.  She did undergo an upper endoscopy a few weeks ago; findings included a 2 cm hiatal hernia, erythematous duodenoscopy in the bulb but no other gross lesions, and one small salmon colored mucosal patch biopsied was negative for Barrett's.   She did have an ultrasound in February that showed tumefactive sludge in the gallbladder but no biliary ductal dilatation.  Repeat ultrasound last month did not show any gallstones or sludge or gallbladder abnormalities.  The pain usually starts in the subxiphoid region, and radiates to various locations including her shoulders and her back.  She is unable to eat much of her normal diet as this seems to aggravate the pain, but it comes on somewhat randomly.  She has a CT scan pending for next week.  Labs may include CMP and CBC all of this was normal.  Medical history significant for GERD,  Surgical history: Laparoscopic appendectomy, C-section       Allergies  Allergen Reactions  . Codeine     Syncope   . Penicillins Hives    Has patient had a PCN reaction causing immediate rash, facial/tongue/throat swelling, SOB or lightheadedness with hypotension: Yes Has patient had a PCN reaction causing severe rash involving mucus membranes or skin necrosis: No Has patient had a PCN reaction that required hospitalization: No Has patient had a PCN reaction occurring within the last 10 years: No If all of the above answers are "NO", then may proceed with Cephalosporin use.  As a child!         Past Medical History:  Diagnosis Date  . Abdominal pain    epigastric pain to left  side chest,back,arm  . Acid reflux   . Arthritis    as a child         Past Surgical History:  Procedure Laterality Date  . APPENDECTOMY    . CESAREAN SECTION  3x last being 01/12  . EYE SURGERY     lasik  . LAPAROSCOPIC APPENDECTOMY N/A 12/31/2017   Procedure: APPENDECTOMY LAPAROSCOPIC;  Surgeon: Franky MachoJenkins, Mark, MD;  Location: [facility];  Service: General;  Laterality: N/A;         Family History  Problem Relation Age of Onset  . Diabetes Father   . Cancer Maternal Grandmother        lung cancer  . Colon cancer Neg Hx   . Esophageal cancer Neg Hx   . Inflammatory bowel disease Neg Hx   . Liver disease Neg Hx   . Pancreatic cancer Neg Hx   . Rectal cancer Neg Hx   . Stomach cancer Neg Hx     Social History        Socioeconomic History  . Marital status: Married    Spouse name: Not on file  . Number of children: 3  . Years of education: Not on file  . Highest education level: Not on file  Occupational History    Employer: UNEMPLOYED  Social Needs  . Financial resource strain: Not on file  . Food insecurity:    Worry: Not on file    Inability: Not on file  .  Transportation needs:    Medical: Not on file    Non-medical: Not on file  Tobacco Use  . Smoking status: Never Smoker  . Smokeless tobacco: Never Used  Substance and Sexual Activity  . Alcohol use: No  . Drug use: No  . Sexual activity: Yes    Birth control/protection: I.U.D.    Comment: IUD placed 01/2011  Lifestyle  . Physical activity:    Days per week: Not on file    Minutes per session: Not on file  . Stress: Not on file  Relationships  . Social connections:    Talks on phone: Not on file    Gets together: Not on file    Attends religious service: Not on file    Active member of club or organization: Not on file    Attends meetings of clubs or organizations: Not on file    Relationship status: Not on file  Other Topics Concern  . 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   Married.                Current Outpatient Medications on File Prior to Visit  Medication Sig Dispense Refill  . HYDROcodone-acetaminophen (NORCO/VICODIN) 5-325 MG tablet Take 1 tablet by mouth every 4 (four) hours as needed. One tab q4h prn pain (Patient not taking: Reported on 05/13/2019) 12 tablet 0  . omeprazole (PRILOSEC) 40 MG capsule 1 tab po bid 60 capsule 6  . ondansetron (ZOFRAN ODT) 4 MG disintegrating tablet Take 1 tablet (4 mg total) by mouth every 8 (eight) hours as needed for nausea or vomiting. 8 tablet 0  . sucralfate (CARAFATE) 1 g tablet Take 1 tablet (1 g total) by mouth 4 (four) times daily. (Patient taking differently: Take 1 g by mouth 2 (two) times daily. ) 160 tablet 0   No current facility-administered medications on file prior to visit.     Review of Systems: a complete, 10pt review of systems was completed with pertinent positives and negatives as documented in the HPI  General Present- Fatigue. Not Present- Appetite Loss, Chills, Fever, Night Sweats, Weight Gain and Weight Loss. Skin Present- Dryness. Not Present- Change in Wart/Mole, Hives, Jaundice, New Lesions, Non-Healing Wounds, Rash and Ulcer. Cardiovascular Present- Chest Pain, Palpitations and Rapid Heart Rate. Not Present- Difficulty Breathing Lying Down, Leg Cramps, Shortness of Breath and Swelling of Extremities. Gastrointestinal Present- Abdominal Pain, Excessive gas and Indigestion. Not Present- Bloating, Bloody Stool, Change in Bowel Habits, Chronic diarrhea, Constipation, Difficulty Swallowing, Gets full quickly at meals, Hemorrhoids, Nausea, Rectal Pain and Vomiting. Female Genitourinary Present- Urgency. Not Present- Frequency, Nocturia, Painful Urination and Pelvic Pain. Musculoskeletal Present- Back Pain, Joint Pain and Muscle  Weakness. Not Present- Joint Stiffness, Muscle Pain and Swelling of Extremities. Hematology Present- Easy Bruising. Not Present- Blood Thinners, Excessive bleeding, Gland problems, HIV and Persistent Infections.   Physical Exam:  05/27/2019 12:02 PM Weight: 128.4 lb Height: 64in Body Surface Area: 1.62 m Body Mass Index: 22.04 kg/m  Temp.: 97.48F(Temporal)  Pulse: 94 (Regular)  BP: 108/72 (Sitting, Left Arm, Standard)  Gen: alert and well appearing Eye: extraocular motion intact, no scleral icterus ENT: moist mucus membranes, dentition intact Neck: no mass or thyromegaly Chest: unlabored respirations, symmetrical air entry, clear bilaterally CV: regular rate and rhythm, no pedal edema Abdomen: soft, mildly  tender in the epigastric and right subcostal as well as right periumbilical fields, nondistended. No mass or organomegaly MSK: strength symmetrical throughout, no deformity Neuro: grossly intact, normal gait Psych: normal mood and affect, appropriate insight Skin: warm and dry, no rash or lesion on limited exam  CBC Latest Ref Rng & Units 05/12/2019 05/04/2019 01/26/2019  WBC 4.0 - 10.5 K/uL 9.0 6.7 10.7(H)  Hemoglobin 12.0 - 15.0 g/dL 13.8 13.8 13.6  Hematocrit 36.0 - 46.0 % 40.7 40.4 41.5  Platelets 150 - 400 K/uL 302 254 290    CMP Latest Ref Rng & Units 05/12/2019 05/04/2019 01/26/2019  Glucose 70 - 99 mg/dL 105(H) 119(H) 98  BUN 6 - 20 mg/dL 11 12 9   Creatinine 0.44 - 1.00 mg/dL 0.65 0.68 0.69  Sodium 135 - 145 mmol/L 137 135 137  Potassium 3.5 - 5.1 mmol/L 3.5 3.7 3.6  Chloride 98 - 111 mmol/L 104 104 106  CO2 22 - 32 mmol/L 24 23 23   Calcium 8.9 - 10.3 mg/dL 9.0 8.6(L) 9.1  Total Protein 6.5 - 8.1 g/dL 7.8 7.7 7.9  Total Bilirubin 0.3 - 1.2 mg/dL 0.4 0.5 0.4  Alkaline Phos 38 - 126 U/L 58 57 48  AST 15 - 41 U/L 17 21 13(L)  ALT 0 - 44 U/L 19 26 10     Recent Labs  No results found for: INR, PROTIME    Imaging: Imaging Results (Last 48 hours)   No results found.      A/P: ABDOMINAL PAIN (R10.9) Story: Her symptoms are consistent with biliary etiology. She does have a hiatal hernia but this is not likely to be continuing to her pain although it certainly can be contributing to her reflux. We had a long discussion today. She has been through a fairly extensive workup and really cannot find any relief from her symptoms. I do recommend proceeding with laparoscopic cholecystectomy with possible cholangiogram. Discussed risks of surgery including bleeding, pain, scarring, intraabdominal injury specifically to the common bile duct and sequelae, conversion to open surgery, failure to resolve symptoms, blood clots/ pulmonary embolus, heart attack, pneumonia, stroke, death. Questions welcomed and answered to patient's satisfaction. She wishes to proceed with surgery.   Update 7/17: CT 6/10 demonstrates a miniscule umbilical fascial defect and a left adnexal tubular structure. Also fairly large stool burden. She has followed up with Dr. Pamala Hurry and pelvic US does not show the adnexal lesion. Dr. Pamala Hurry requests we survey the pelvis during lap chole.   Romana Juniper, MD [facility], [place] Pager [phone]

## 2019-07-09 NOTE — Transfer of Care (Signed)
Immediate Anesthesia Transfer of Care Note  Patient: Casey Larson  Procedure(s) Performed: LAPAROSCOPIC CHOLECYSTECTOMY (N/A Abdomen)  Patient Location: PACU  Anesthesia Type:General  Level of Consciousness: drowsy  Airway & Oxygen Therapy: Patient Spontanous Breathing and Patient connected to nasal cannula oxygen  Post-op Assessment: Report given to RN and Post -op Vital signs reviewed and stable  Post vital signs: Reviewed and stable  Last Vitals:  Vitals Value Taken Time  BP    Temp    Pulse 86 07/09/19 1039  Resp 16 07/09/19 1039  SpO2 100 % 07/09/19 1039  Vitals shown include unvalidated device data.  Last Pain:  Vitals:   07/09/19 0731  TempSrc:   PainSc: 0-No pain      Patients Stated Pain Goal: 5 (16/96/78 9381)  Complications: No apparent anesthesia complications

## 2019-07-09 NOTE — Anesthesia Procedure Notes (Signed)
Procedure Name: Intubation Date/Time: 07/09/2019 9:04 AM Performed by: Valda Favia, CRNA Pre-anesthesia Checklist: Patient identified, Emergency Drugs available, Suction available and Patient being monitored Patient Re-evaluated:Patient Re-evaluated prior to induction Oxygen Delivery Method: Circle System Utilized Preoxygenation: Pre-oxygenation with 100% oxygen Induction Type: IV induction Ventilation: Mask ventilation without difficulty Laryngoscope Size: Mac and 4 Grade View: Grade I Tube type: Oral Tube size: 7.0 mm Number of attempts: 1 Airway Equipment and Method: Stylet and Oral airway Placement Confirmation: ETT inserted through vocal cords under direct vision,  positive ETCO2 and breath sounds checked- equal and bilateral Secured at: 20 cm Tube secured with: Tape Dental Injury: Teeth and Oropharynx as per pre-operative assessment

## 2019-07-09 NOTE — Anesthesia Postprocedure Evaluation (Signed)
Anesthesia Post Note  Patient: Casey Larson  Procedure(s) Performed: LAPAROSCOPIC CHOLECYSTECTOMY (N/A Abdomen)     Patient location during evaluation: PACU Anesthesia Type: General Level of consciousness: awake and alert Pain management: pain level controlled Vital Signs Assessment: post-procedure vital signs reviewed and stable Respiratory status: spontaneous breathing, nonlabored ventilation, respiratory function stable and patient connected to nasal cannula oxygen Cardiovascular status: blood pressure returned to baseline and stable Postop Assessment: no apparent nausea or vomiting Anesthetic complications: no    Last Vitals:  Vitals:   07/09/19 1141 07/09/19 1214  BP: 108/62 (!) 99/58  Pulse: 75 76  Resp: 15   Temp:  36.4 C  SpO2: 96% 97%    Last Pain:  Vitals:   07/09/19 1214  TempSrc:   PainSc: Asleep                 Jaykob Minichiello L Muhammad Vacca

## 2019-07-09 NOTE — Anesthesia Preprocedure Evaluation (Addendum)
Anesthesia Evaluation  Patient identified by MRN, date of birth, ID band Patient awake    Reviewed: Allergy & Precautions, NPO status , Patient's Chart, lab work & pertinent test results  Airway Mallampati: I  TM Distance: >3 FB Neck ROM: Full    Dental no notable dental hx. (+) Teeth Intact, Dental Advisory Given   Pulmonary neg pulmonary ROS,    Pulmonary exam normal breath sounds clear to auscultation       Cardiovascular negative cardio ROS Normal cardiovascular exam Rhythm:Regular Rate:Normal     Neuro/Psych negative neurological ROS  negative psych ROS   GI/Hepatic Neg liver ROS, hiatal hernia, GERD  ,  Endo/Other  negative endocrine ROS  Renal/GU negative Renal ROS  negative genitourinary   Musculoskeletal  (+) Arthritis ,   Abdominal   Peds  Hematology negative hematology ROS (+)   Anesthesia Other Findings   Reproductive/Obstetrics                            Anesthesia Physical Anesthesia Plan  ASA: II  Anesthesia Plan: General   Post-op Pain Management:    Induction: Intravenous  PONV Risk Score and Plan: 3 and Midazolam, Dexamethasone and Ondansetron  Airway Management Planned: Oral ETT  Additional Equipment:   Intra-op Plan:   Post-operative Plan: Extubation in OR  Informed Consent: I have reviewed the patients History and Physical, chart, labs and discussed the procedure including the risks, benefits and alternatives for the proposed anesthesia with the patient or authorized representative who has indicated his/her understanding and acceptance.     Dental advisory given  Plan Discussed with: CRNA  Anesthesia Plan Comments:         Anesthesia Quick Evaluation

## 2019-07-10 ENCOUNTER — Encounter (HOSPITAL_COMMUNITY): Payer: Self-pay | Admitting: Surgery

## 2019-08-21 IMAGING — US ULTRASOUND ABDOMEN LIMITED
1 series · 14 of 25 positions shown · non-contrast
Comparison: 02/03/2019

CLINICAL DATA: Epigastric, right upper quadrant pain

EXAM:
ULTRASOUND ABDOMEN LIMITED RIGHT UPPER QUADRANT

[Series 1: ultrasound abdomen limited · 0.18mm/px · 14 of 59 slices shown]
[im 1/59]
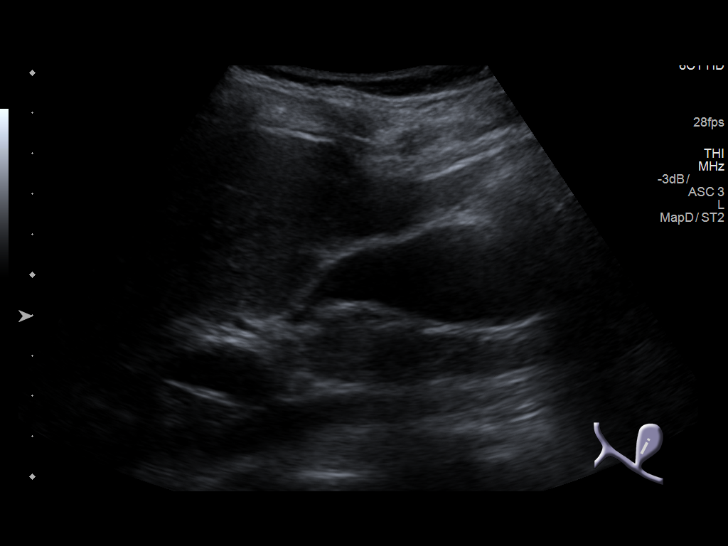
[im 5/59]
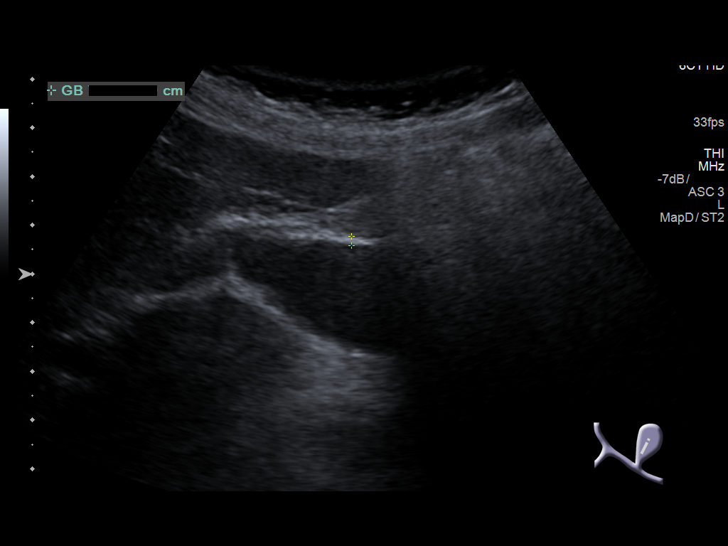
[im 10/59]
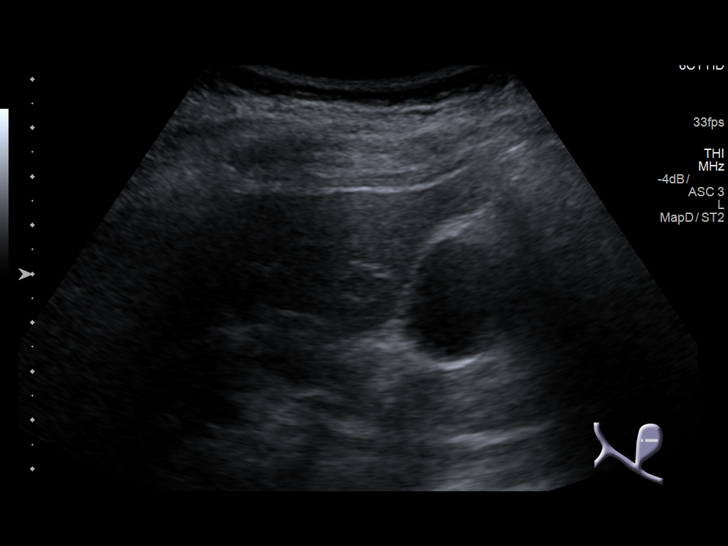
[im 15/59]
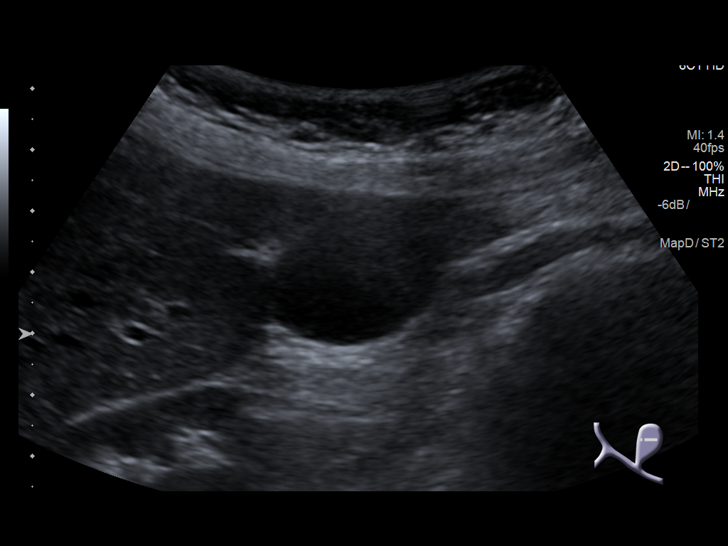
[im 20/59]
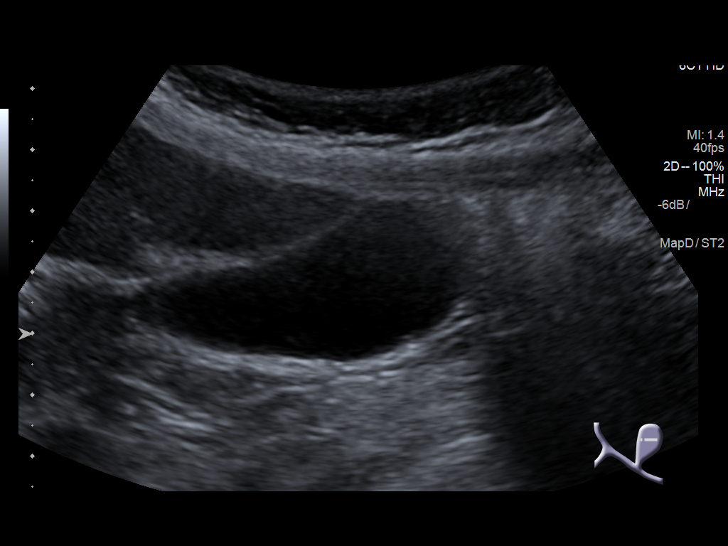
[im 22/59]
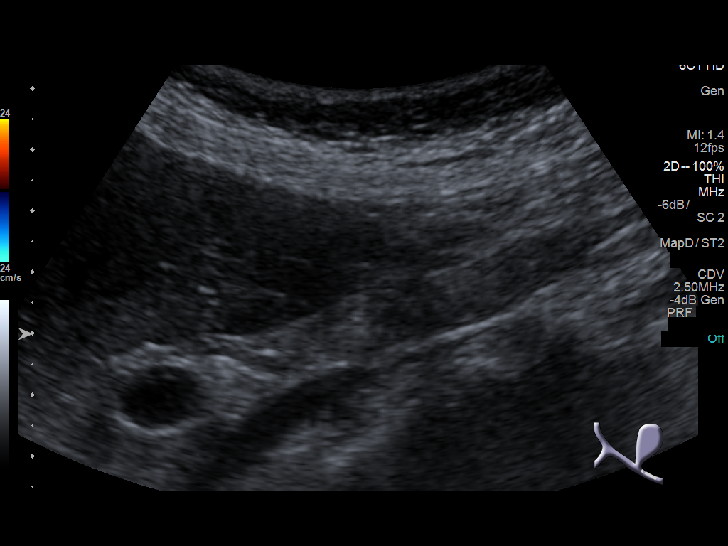
[im 27/59]
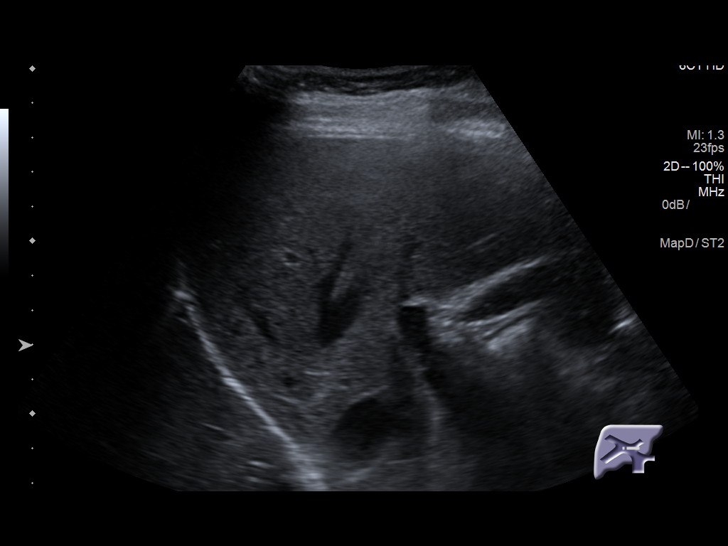
[im 32/59]
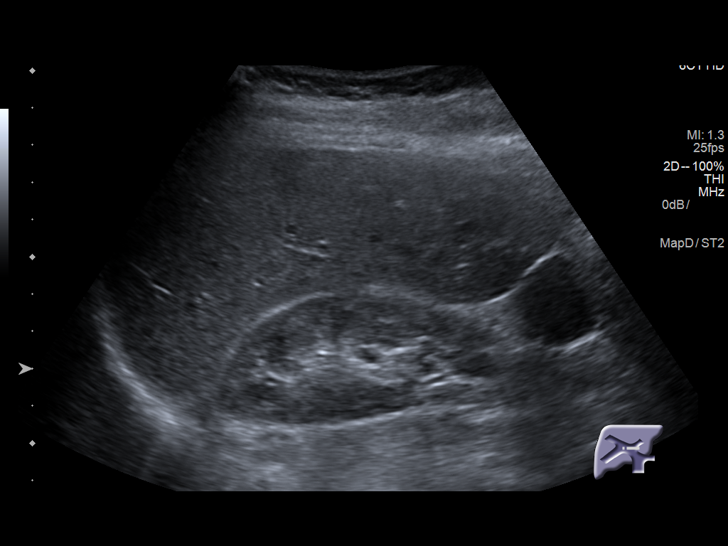
[im 37/59]
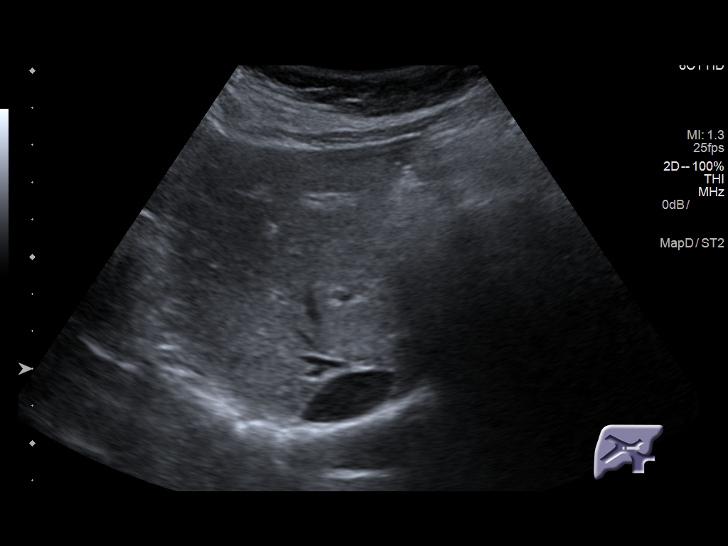
[im 39/59]
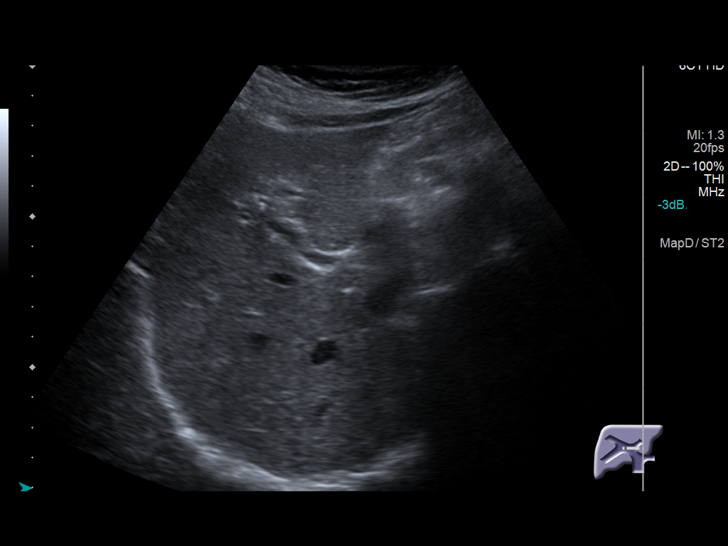
[im 44/59]
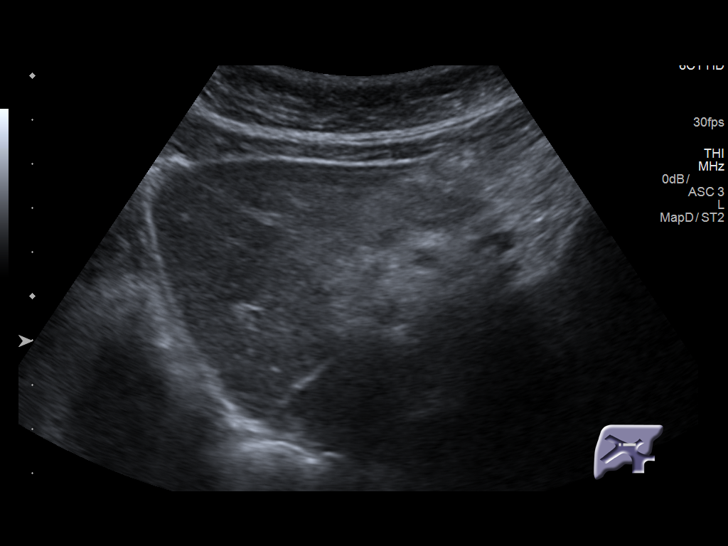
[im 49/59]
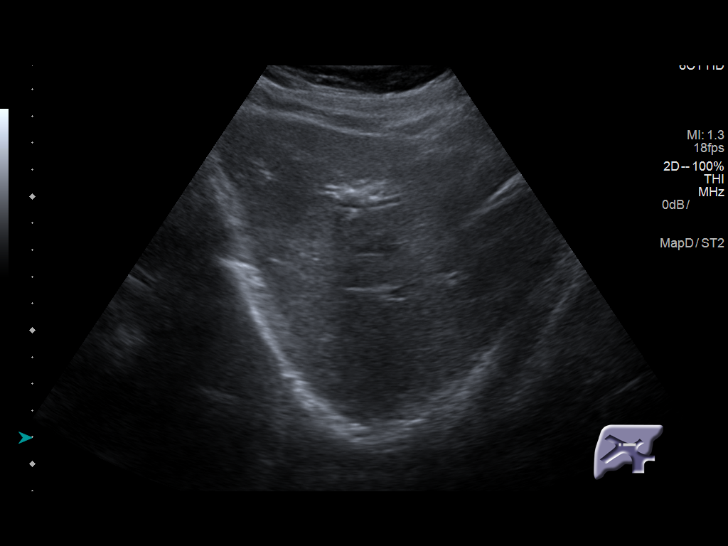
[im 54/59]
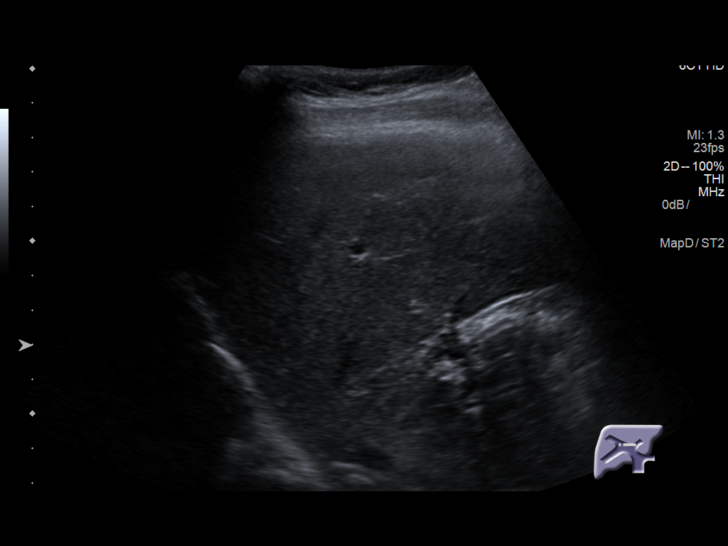
[im 59/59]
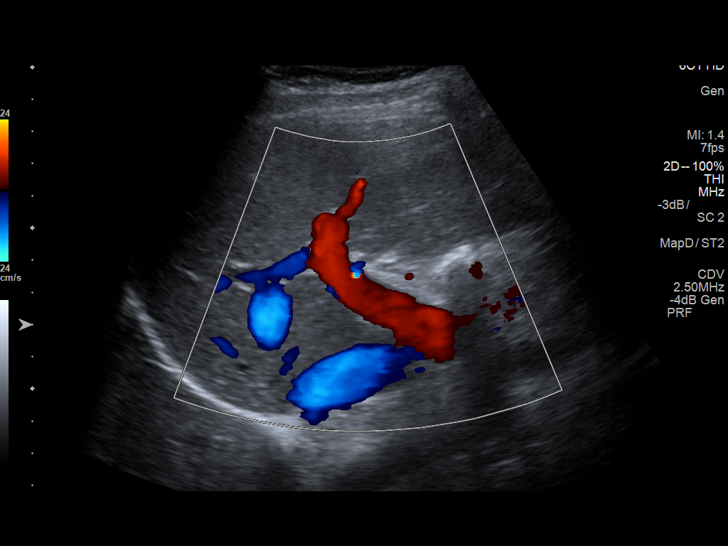

[14 of 25 positions shown; findings below may reference images not displayed]

FINDINGS: Gallbladder:

No gallstones or wall thickening visualized. No sonographic Murphy
sign noted by sonographer. Previously seen sludge no longer
visualized.

Common bile duct:

Diameter: Normal caliber, 2 mm

Liver:

No focal lesion identified. Within normal limits in parenchymal
echogenicity. Portal vein is patent on color Doppler imaging with
normal direction of blood flow towards the liver.
IMPRESSION: Normal right upper quadrant ultrasound.

## 2019-09-22 DIAGNOSIS — Z1151 Encounter for screening for human papillomavirus (HPV): Secondary | ICD-10-CM | POA: Diagnosis not present

## 2019-09-22 DIAGNOSIS — Z01419 Encounter for gynecological examination (general) (routine) without abnormal findings: Secondary | ICD-10-CM | POA: Diagnosis not present

## 2019-09-22 DIAGNOSIS — Z6822 Body mass index (BMI) 22.0-22.9, adult: Secondary | ICD-10-CM | POA: Diagnosis not present

## 2020-01-03 ENCOUNTER — Other Ambulatory Visit: Payer: Self-pay | Admitting: Gastroenterology

## 2020-03-01 IMAGING — CT CT ANGIO CHEST
2 of 6 series · 18 of 46 positions shown · IV contrast (Isovue)
Comparison: CT abdomen and pelvis 12/30/2017

CLINICAL DATA: Chest pain for 2 days. Positive D-dimer.

EXAM:
CT ANGIOGRAPHY CHEST WITH CONTRAST
TECHNIQUE: Multidetector CT imaging of the chest was performed using the
standard protocol during bolus administration of intravenous
contrast. Multiplanar CT image reconstructions and MIPs were
obtained to evaluate the vascular anatomy.
CONTRAST:  100mL I3KKFW-2SH IOPAMIDOL (I3KKFW-2SH) INJECTION 76%

[Series 5: thins · axial · 0.61mm/px · z∈[-37,+164]mm · 15 of 221 slices shown]
[im 10/221  lung]
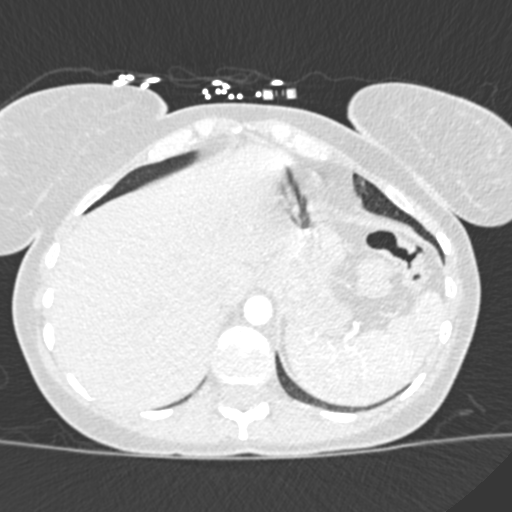
[im 29/221  soft-tissue]
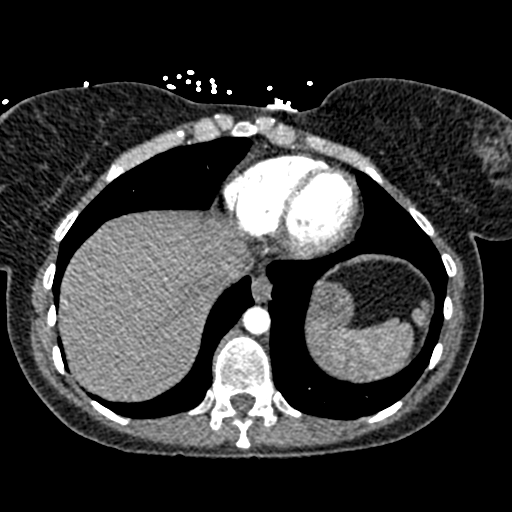
[im 39/221  lung]
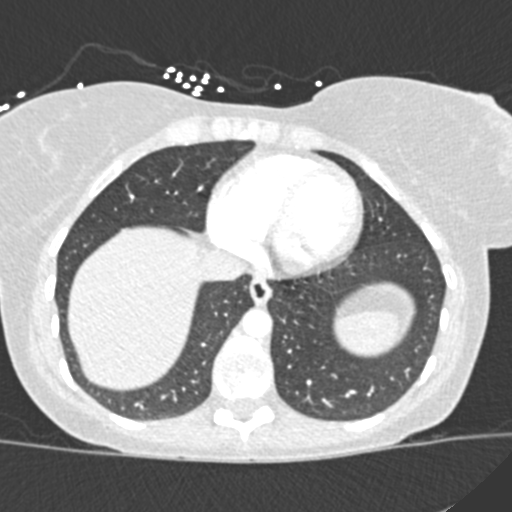
[im 58/221  soft-tissue]
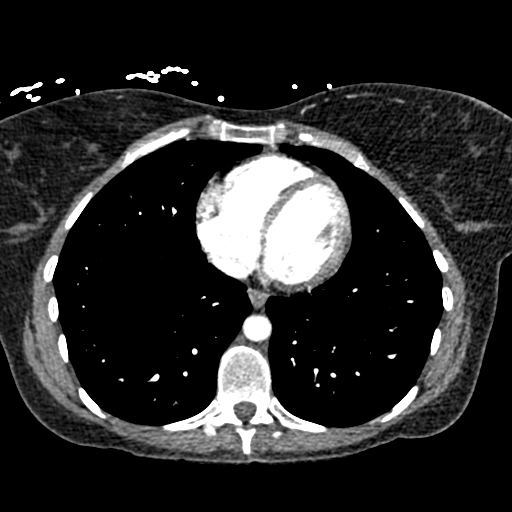
[im 67/221  lung]
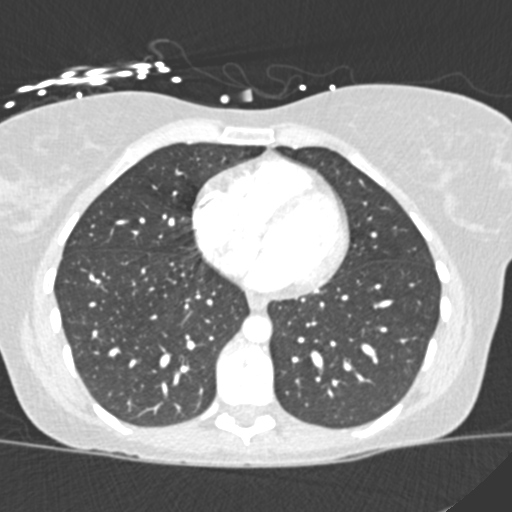
[im 87/221  soft-tissue]
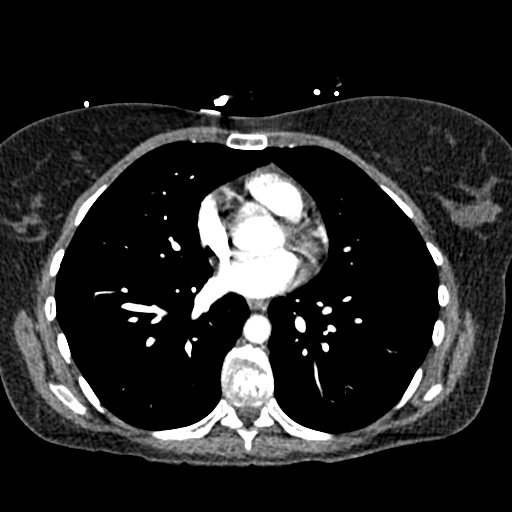
[im 96/221  lung]
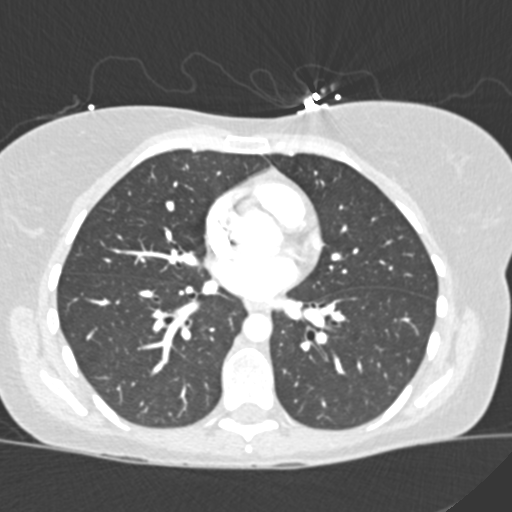
[im 115/221  soft-tissue]
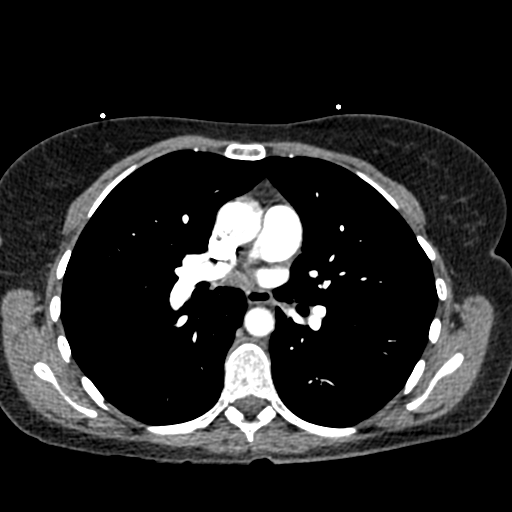
[im 125/221  lung]
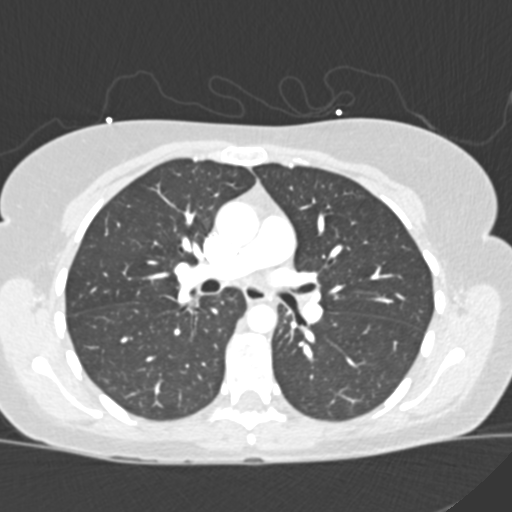
[im 134/221  soft-tissue]
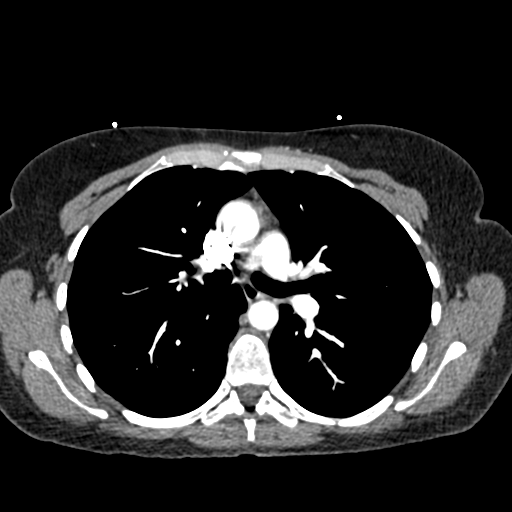
[im 154/221  lung]
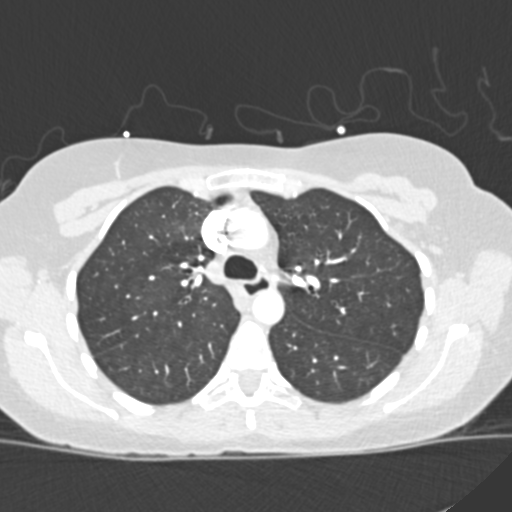
[im 163/221  soft-tissue]
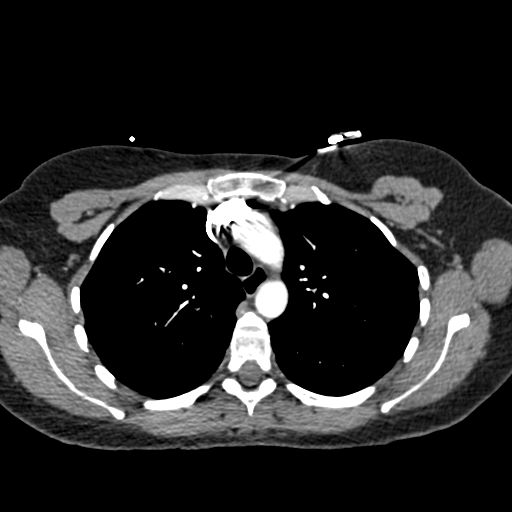
[im 182/221  lung]
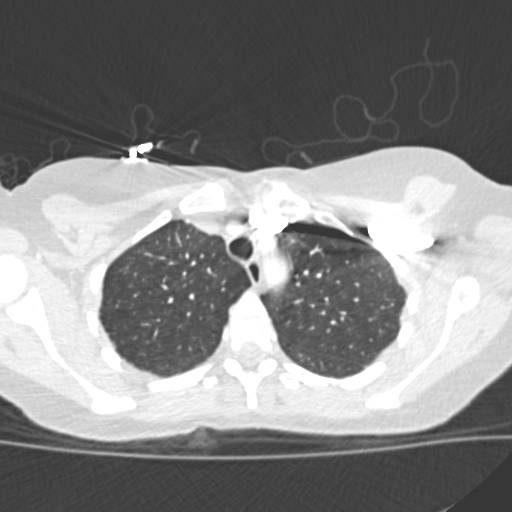
[im 192/221  soft-tissue]
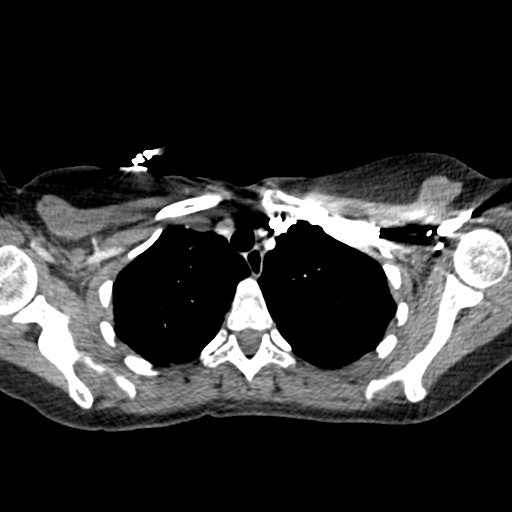
[im 211/221  lung]
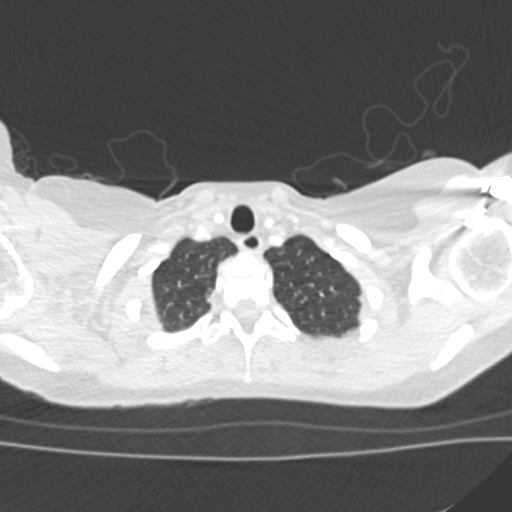

[Series 7: coronal mpr · coronal · 0.48mm/px · 3 of 122 slices shown]
[im 31/122  soft-tissue]
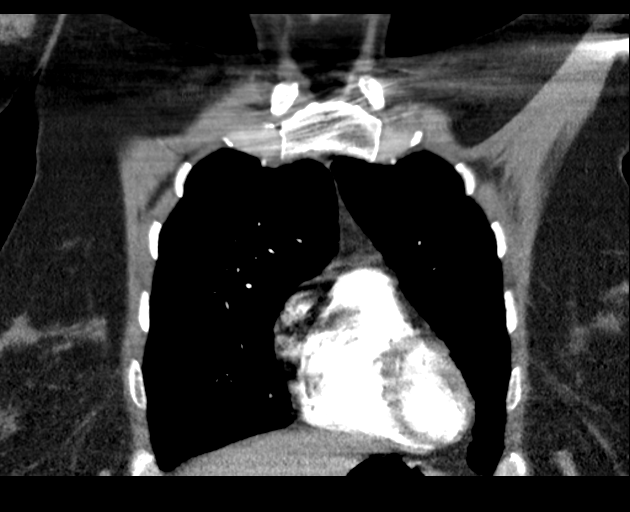
[im 61/122  soft-tissue]
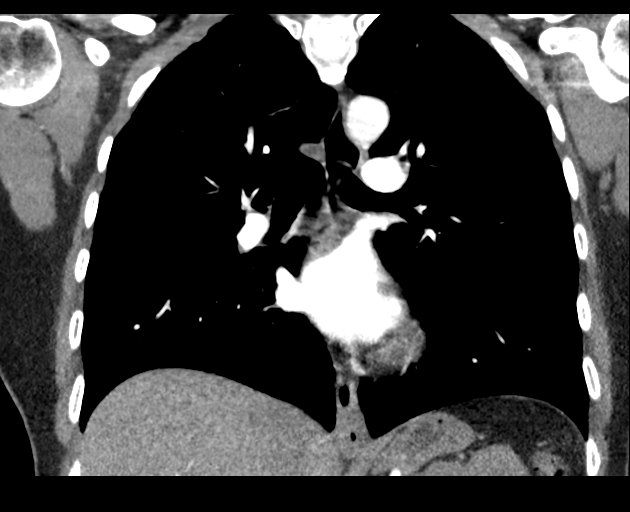
[im 91/122  soft-tissue]
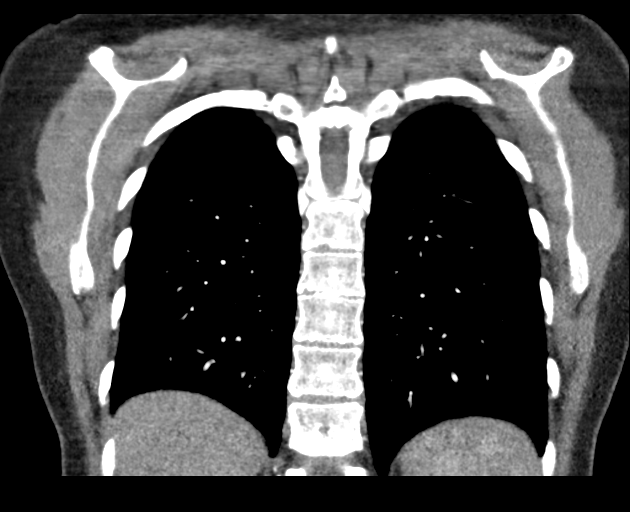

[18 of 46 positions shown; findings below may reference images not displayed]

FINDINGS: Cardiovascular: Good opacification of the central and segmental
pulmonary arteries. No focal filling defects. No evidence of
significant pulmonary embolus. Normal heart size. No pericardial
effusions. Normal caliber thoracic aorta. No aortic dissection.
Great vessel origins are patent.

Mediastinum/Nodes: Esophagus is decompressed. No significant
lymphadenopathy in the chest.

Lungs/Pleura: Lungs are clear. No airspace disease or consolidation.
No pleural effusions. No pneumothorax. Airways are patent.

Upper Abdomen: No acute abnormalities identified.

Musculoskeletal: No destructive bone lesions. Mild degenerative
changes in the thoracic spine.

Review of the MIP images confirms the above findings.
IMPRESSION: No evidence of significant pulmonary embolus. No evidence of active
pulmonary disease.

## 2020-03-14 ENCOUNTER — Ambulatory Visit: Payer: BC Managed Care – PPO | Attending: Medical

## 2020-09-11 ENCOUNTER — Telehealth: Payer: Self-pay | Admitting: *Deleted

## 2020-09-11 NOTE — Telephone Encounter (Signed)
Who Is Calling Patient / Member / Family / Caregiver Call Type Triage / Clinical Caller Name Clairissa Valvano Relationship To Patient Spouse Return Phone Number [phone] (Primary) Chief Complaint Fever (non urgent symptom) (> THREE MONTHS) Reason for Call Symptomatic / Request for Health Information Initial Comment 2 of 2 Caller and his wife have Covid and wants to know what to look out for has a cough sore throat and is fatigue she also has congestion. She has been sick since the 11th with covid and him since the 14th. She has a fever and took meds as well Nurse Assessment Nurse: Tresa Endo, RN, Kim Date/Time (Eastern Time): 09/09/2020 5:14:10 PM Confirm and document reason for call. If symptomatic, describe symptoms. ---Caller states his wife is COVID positive, sxs started on 9/11 and she has fatigue, fever which has come down with Tylenol, cough, sinus pain and body aches.   Disp. Time Lamount Cohen Time) Disposition Final User 09/09/2020 5:18:59 PM Home Care Yes Tresa Endo, RN, [facility] care given by nurse

## 2021-01-05 DIAGNOSIS — R222 Localized swelling, mass and lump, trunk: Secondary | ICD-10-CM | POA: Diagnosis not present

## 2021-01-05 DIAGNOSIS — N644 Mastodynia: Secondary | ICD-10-CM | POA: Diagnosis not present

## 2021-01-11 ENCOUNTER — Other Ambulatory Visit: Payer: Self-pay | Admitting: Obstetrics and Gynecology

## 2021-01-11 DIAGNOSIS — N644 Mastodynia: Secondary | ICD-10-CM

## 2021-01-17 ENCOUNTER — Ambulatory Visit
Admission: RE | Admit: 2021-01-17 | Discharge: 2021-01-17 | Disposition: A | Discharge status: Home / Self Care | Payer: BC Managed Care – PPO | Source: Ambulatory Visit | Attending: Obstetrics and Gynecology | Admitting: Obstetrics and Gynecology

## 2021-01-17 ENCOUNTER — Other Ambulatory Visit: Payer: Self-pay

## 2021-01-17 DIAGNOSIS — N644 Mastodynia: Secondary | ICD-10-CM

## 2021-01-17 DIAGNOSIS — R928 Other abnormal and inconclusive findings on diagnostic imaging of breast: Secondary | ICD-10-CM | POA: Diagnosis not present

## 2021-02-26 ENCOUNTER — Other Ambulatory Visit: Payer: Self-pay

## 2021-02-26 ENCOUNTER — Emergency Department (HOSPITAL_COMMUNITY): Payer: BC Managed Care – PPO

## 2021-02-26 ENCOUNTER — Encounter (HOSPITAL_COMMUNITY): Payer: Self-pay | Admitting: *Deleted

## 2021-02-26 ENCOUNTER — Emergency Department (HOSPITAL_COMMUNITY)
Admission: EM | Admit: 2021-02-26 | Discharge: 2021-02-26 | Disposition: A | Discharge status: Home / Self Care | Payer: BC Managed Care – PPO | Attending: Emergency Medicine | Admitting: Emergency Medicine

## 2021-02-26 DIAGNOSIS — R079 Chest pain, unspecified: Secondary | ICD-10-CM | POA: Diagnosis not present

## 2021-02-26 DIAGNOSIS — R0789 Other chest pain: Secondary | ICD-10-CM | POA: Insufficient documentation

## 2021-02-26 DIAGNOSIS — R072 Precordial pain: Secondary | ICD-10-CM | POA: Diagnosis not present

## 2021-02-26 DIAGNOSIS — Z79899 Other long term (current) drug therapy: Secondary | ICD-10-CM | POA: Diagnosis not present

## 2021-02-26 DIAGNOSIS — K219 Gastro-esophageal reflux disease without esophagitis: Secondary | ICD-10-CM | POA: Insufficient documentation

## 2021-02-26 LAB — CBC WITH DIFFERENTIAL/PLATELET
Abs Immature Granulocytes: 0.01 10*3/uL (ref 0.00–0.07)
Basophils Absolute: 0 10*3/uL (ref 0.0–0.1)
Basophils Relative: 1 %
Eosinophils Absolute: 0.1 10*3/uL (ref 0.0–0.5)
Eosinophils Relative: 1 %
HCT: 38.8 % (ref 36.0–46.0)
Hemoglobin: 12.9 g/dL (ref 12.0–15.0)
Immature Granulocytes: 0 %
Lymphocytes Relative: 34 %
Lymphs Abs: 2 10*3/uL (ref 0.7–4.0)
MCH: 30.4 pg (ref 26.0–34.0)
MCHC: 33.2 g/dL (ref 30.0–36.0)
MCV: 91.3 fL (ref 80.0–100.0)
Monocytes Absolute: 0.3 10*3/uL (ref 0.1–1.0)
Monocytes Relative: 6 %
Neutro Abs: 3.4 10*3/uL (ref 1.7–7.7)
Neutrophils Relative %: 58 %
Platelets: 304 10*3/uL (ref 150–400)
RBC: 4.25 MIL/uL (ref 3.87–5.11)
RDW: 13.2 % (ref 11.5–15.5)
WBC: 5.8 10*3/uL (ref 4.0–10.5)
nRBC: 0 % (ref 0.0–0.2)

## 2021-02-26 LAB — BASIC METABOLIC PANEL
Anion gap: 9 (ref 5–15)
BUN: 8 mg/dL (ref 6–20)
CO2: 21 mmol/L — ABNORMAL LOW (ref 22–32)
Calcium: 8.7 mg/dL — ABNORMAL LOW (ref 8.9–10.3)
Chloride: 106 mmol/L (ref 98–111)
Creatinine, Ser: 0.71 mg/dL (ref 0.44–1.00)
GFR, Estimated: >60 mL/min (ref >60)
Glucose, Bld: 105 mg/dL — ABNORMAL HIGH (ref 70–99)
Potassium: 3.5 mmol/L (ref 3.5–5.1)
Sodium: 136 mmol/L (ref 135–145)

## 2021-02-26 LAB — TROPONIN I (HIGH SENSITIVITY): Troponin I (High Sensitivity): <2 ng/L (ref <18)

## 2021-02-26 MED ORDER — CYCLOBENZAPRINE HCL 10 MG PO TABS
10.0000 mg | ORAL_TABLET | Freq: Once | ORAL | Status: AC
  Administered 2021-02-26: 10 mg via ORAL
  Filled 2021-02-26: qty 1

## 2021-02-26 MED ORDER — SUCRALFATE 1 GM/10ML PO SUSP
1.0000 g | Freq: Three times a day (TID) | ORAL | Status: DC
  Administered 2021-02-26: 1 g via ORAL
  Filled 2021-02-26: qty 10

## 2021-02-26 MED ORDER — PANTOPRAZOLE SODIUM 40 MG IV SOLR: 40.0000 mg | Freq: Once | INTRAVENOUS | Status: DC

## 2021-02-26 MED ORDER — ALUM & MAG HYDROXIDE-SIMETH 200-200-20 MG/5ML PO SUSP
30.0000 mL | Freq: Once | ORAL | Status: AC
  Administered 2021-02-26: 30 mL via ORAL
  Filled 2021-02-26: qty 30

## 2021-02-26 MED ORDER — LIDOCAINE VISCOUS HCL 2 % MT SOLN
15.0000 mL | Freq: Once | OROMUCOSAL | Status: AC
  Administered 2021-02-26: 15 mL via ORAL
  Filled 2021-02-26: qty 15

## 2021-02-26 NOTE — ED Provider Notes (Signed)
[facility] EMERGENCY DEPARTMENT Provider Note   CSN: [id number] Arrival date & time: 02/26/21  1516     History Chief Complaint  Patient presents with  . Chest Pain    Casey Larson is a 40 y.o. female.  HPI   Patient with no significant medical history of GERD presents with complaint of left-sided chest pain x6 days.  She endorses the chest pain is located on her sternum and radiating to her left side of her chest, she describes it as a sharp sensation and a burning sensation.  She states that laying down tends to make the pain worse, denies associated with shortness of breath, lightheadedness, dizziness, nausea, vomiting, or exertion making it worse.  She has no history of PEs or DVTs, not on birth control, no cardiac history.  She does endorse that she has significant GERD but she states this feels different from her prior GERD attacks.  She is currently on omeprazole taking it daily.  She denies alleviating factors.  Patient denies headaches, fevers, chills, shortness of breath, abdominal pain, nausea, vomiting, diarrhea, worsening pedal edema.  Past Medical History:  Diagnosis Date  . Abdominal pain    epigastric pain to left side chest,back,arm  . Acid reflux   . Arthritis    as a child  . History of hiatal hernia     Patient Active Problem List   Diagnosis Date Noted  . Pyrosis 03/12/2019  . Gastroesophageal reflux disease 01/30/2019  . Epigastric pain 01/30/2019  . Atypical chest pain 01/30/2019  . Acute appendicitis 12/30/2017  . Appendicitis 12/30/2017  . Vasovagal near syncope 03/12/2014  . URI (upper respiratory infection) 10/02/2012  . Left breast mass 10/02/2012    Past Surgical History:  Procedure Laterality Date  . APPENDECTOMY    . CESAREAN SECTION  3x last being 01/12  . CHOLECYSTECTOMY N/A 07/09/2019   Procedure: LAPAROSCOPIC CHOLECYSTECTOMY;  Surgeon: Berna Bue, MD;  Location: [facility] OR;  Service: General;  Laterality: N/A;  . EYE SURGERY      lasik  . LAPAROSCOPIC APPENDECTOMY N/A 12/31/2017   Procedure: APPENDECTOMY LAPAROSCOPIC;  Surgeon: Franky Macho, MD;  Location: [facility] ORS;  Service: General;  Laterality: N/A;  . WISDOM TOOTH EXTRACTION       OB History   No obstetric history on file.     Family History  Problem Relation Age of Onset  . Diabetes Father   . Cancer Maternal Grandmother        lung cancer  . Breast cancer Maternal Aunt   . Breast cancer Cousin   . Colon cancer Neg Hx   . Esophageal cancer Neg Hx   . Inflammatory bowel disease Neg Hx   . Liver disease Neg Hx   . Pancreatic cancer Neg Hx   . Rectal cancer Neg Hx   . Stomach cancer Neg Hx     Social History   Tobacco Use  . Smoking status: Never Smoker  . Smokeless tobacco: Never Used  Vaping Use  . Vaping Use: Never used  Substance Use Topics  . Alcohol use: No  . Drug use: No    Home Medications Prior to Admission medications   Medication Sig Start Date End Date Taking? Authorizing Provider  acetaminophen (TYLENOL) 500 MG tablet Take 1,000 mg by mouth every 6 (six) hours as needed for moderate pain or headache.   Yes [provider]  Naphazoline HCl (CLEAR EYES OP) Place 1 drop into both eyes daily.   Yes [provider]  omeprazole (PRILOSEC) 40 MG capsule 1 tab po bid Patient taking differently: Take 40 mg by mouth daily. 05/27/19  Yes Saguier, Ramon Dredge, PA-C  Polyvinyl Alcohol-Povidone (REFRESH OP) Place 1 drop into both eyes daily.   Yes [provider]    Allergies    Codeine and Penicillins  Review of Systems   Review of Systems  Constitutional: Negative for chills and fever.  HENT: Negative for congestion and sore throat.   Eyes: Negative for visual disturbance.  Respiratory: Negative for cough and shortness of breath.   Cardiovascular: Positive for chest pain.  Gastrointestinal: Negative for abdominal pain, diarrhea, nausea and vomiting.  Genitourinary: Negative for enuresis.  Musculoskeletal:  Negative for back pain.  Skin: Negative for rash.  Neurological: Negative for dizziness and headaches.  Hematological: Does not bruise/bleed easily.    Physical Exam Updated Vital Signs BP 111/78 (BP Location: Right Arm)   Pulse 67   Temp 98.5 F (36.9 C) (Oral)   Resp 18   Ht 5\' 4"  (1.626 m)   Wt 63.5 kg   LMP 02/24/2021   SpO2 99%   BMI 24.03 kg/m   Physical Exam Vitals and nursing note reviewed.  Constitutional:      General: She is not in acute distress.    Appearance: She is not ill-appearing.  HENT:     Head: Normocephalic and atraumatic.     Nose: No congestion.  Eyes:     Conjunctiva/sclera: Conjunctivae normal.  Cardiovascular:     Rate and Rhythm: Normal rate and regular rhythm.     Pulses: Normal pulses.     Heart sounds: No murmur heard. No friction rub. No gallop.      Comments: Cannot reproduce pain upon palpation. Pulmonary:     Effort: No respiratory distress.     Breath sounds: No wheezing, rhonchi or rales.  Abdominal:     Palpations: Abdomen is soft.     Tenderness: There is no abdominal tenderness.  Musculoskeletal:     Right lower leg: No edema.     Left lower leg: No edema.  Skin:    General: Skin is warm and dry.  Neurological:     Mental Status: She is alert.  Psychiatric:        Mood and Affect: Mood normal.     ED Results / Procedures / Treatments   Labs (all labs ordered are listed, but only abnormal results are displayed) Labs Reviewed  BASIC METABOLIC PANEL - Abnormal; Notable for the following components:      Result Value   CO2 21 (*)    Glucose, Bld 105 (*)    Calcium 8.7 (*)    All other components within normal limits  CBC WITH DIFFERENTIAL/PLATELET  TROPONIN I (HIGH SENSITIVITY)    EKG EKG Interpretation  Date/Time:  Monday February 26 2021 15:27:43 EST Ventricular Rate:  98 PR Interval:    QRS Duration: 84 QT Interval:  348 QTC Calculation: 445 R Axis:   70 Text Interpretation: Sinus rhythm Consider right  atrial enlargement RSR' in V1 or V2, probably normal variant No significant change since prior 5/20 Confirmed by 6/20 [phone]) on 02/26/2021 3:30:16 PM   Radiology DG Chest Port 1 View  Result Date: 02/26/2021 CLINICAL DATA:  Chest pain x6 days. EXAM: PORTABLE CHEST 1 VIEW COMPARISON:  January 26, 2019 FINDINGS: The heart size and mediastinal contours are within normal limits. The lungs are hyperinflated. Both lungs are clear. The visualized skeletal structures are  unremarkable. IMPRESSION: Stable exam without active cardiopulmonary disease. Electronically Signed   By: Aram Candela M.D.   On: 02/26/2021 15:56    Procedures Procedures   Medications Ordered in ED Medications  sucralfate (CARAFATE) 1 GM/10ML suspension 1 g (1 g Oral Given 02/26/21 1641)  alum & mag hydroxide-simeth (MAALOX/MYLANTA) 200-200-20 MG/5ML suspension 30 mL (30 mLs Oral Given 02/26/21 1554)    And  lidocaine (XYLOCAINE) 2 % viscous mouth solution 15 mL (15 mLs Oral Given 02/26/21 1554)  cyclobenzaprine (FLEXERIL) tablet 10 mg (10 mg Oral Given 02/26/21 1640)    ED Course  I have reviewed the triage vital signs and the nursing notes.  Pertinent labs & imaging results that were available during my care of the patient were reviewed by me and considered in my medical decision making (see chart for details).    MDM Rules/Calculators/A&P                         Initial impression-patient presents with chest pain x6 days.  She is alert, does not appear in acute distress, vital signs reassuring.  Suspect patient suffering from GERD, will provide her with GI cocktail, obtain chest pain work-up and reevaluate.  Work-up-CBC unremarkable.  CMP shows slight decrease in CO2 of 21, glucose 105.  Troponin less than 2.  Chest x-ray negative for acute findings.  EKG sinus rhythm not signs of ischemia no ST elevation or depression noted.  Reassessment patient reassessed after providing her with a GI cocktail, she continues to  have burning sensation in her left chest.  Denies any other symptoms at this time.  Will try Carafate and Flexeril and reevaluate.  Patient did well with Carafate, states her burning sensation has since resolved has no complaints at this time.  Lab work and imaging look reassuring.  Patient was explained results and is agreeable to discharge at this time.   Rule out- I have low suspicion for ACS as history is atypical, patient has no cardiac history, EKG was sinus rhythm without signs of ischemia, initial troponin is less than 2.  Will defer second troponin as patient has had chest pain for greater than 12 hours, would expect elevation at this time if ACS was present.  Low suspicion for PE as patient denies pleuritic chest pain, shortness of breath, patient denies leg pain, no pedal edema noted on exam, patient was PERC negative.  Low suspicion for AAA or aortic dissection as history is atypical, patient has low risk factors.  Low suspicion for systemic infection as patient is nontoxic-appearing, vital signs reassuring, no obvious source infection noted on exam.  Plan-I suspect patient's chest pain secondary due to GERD as she describes it as a burning sensation worsening with laying down.  Will recommend that she continue with her Prilosec and follow-up with GI for further evaluation.  Vital signs have remained stable, no indication for hospital admission.  Patient given at home care as well strict return precautions.  Patient verbalized that they understood agreed to said plan.   Final Clinical Impression(s) / ED Diagnoses Final diagnoses:  Atypical chest pain    Rx / DC Orders ED Discharge Orders    None       Carroll Sage, PA-C 02/26/21 1727    Terrilee Files, MD 02/27/21 1032

## 2021-02-26 NOTE — ED Triage Notes (Signed)
Chest pain for 6 days, history of acid reflux

## 2021-02-26 NOTE — ED Triage Notes (Signed)
Also has a twitching in left side of face

## 2021-02-26 NOTE — Discharge Instructions (Addendum)
I suspect you are suffering from acid reflux.  I recommend continuing with an acid pill as prescribed by your doctor.  Also given you recommendation for foods that will decrease acid reflux please read.  I like you to follow-up with GI for further evaluation.  Given the contact information above please call for further evaluation.  Come back to the emergency department if you develop chest pain, shortness of breath, severe abdominal pain, uncontrolled nausea, vomiting, diarrhea.

## 2021-02-26 NOTE — ED Notes (Signed)
Pt in bed, pt denies chest pain, pt states that she is ready to go home.

## 2021-04-24 DIAGNOSIS — Z6824 Body mass index (BMI) 24.0-24.9, adult: Secondary | ICD-10-CM | POA: Diagnosis not present

## 2021-04-24 DIAGNOSIS — Z01419 Encounter for gynecological examination (general) (routine) without abnormal findings: Secondary | ICD-10-CM | POA: Diagnosis not present

## 2022-02-15 DIAGNOSIS — D1722 Benign lipomatous neoplasm of skin and subcutaneous tissue of left arm: Secondary | ICD-10-CM | POA: Diagnosis not present

## 2022-03-04 DIAGNOSIS — Z139 Encounter for screening, unspecified: Secondary | ICD-10-CM | POA: Diagnosis not present

## 2022-04-29 ENCOUNTER — Emergency Department (HOSPITAL_COMMUNITY)
Admission: EM | Admit: 2022-04-29 | Discharge: 2022-04-29 | Disposition: A | Discharge status: Home / Self Care | Payer: 59 | Attending: Emergency Medicine | Admitting: Emergency Medicine

## 2022-04-29 ENCOUNTER — Other Ambulatory Visit: Payer: Self-pay

## 2022-04-29 ENCOUNTER — Encounter (HOSPITAL_COMMUNITY): Payer: Self-pay | Admitting: Emergency Medicine

## 2022-04-29 DIAGNOSIS — M79602 Pain in left arm: Secondary | ICD-10-CM | POA: Diagnosis not present

## 2022-04-29 DIAGNOSIS — M25512 Pain in left shoulder: Secondary | ICD-10-CM | POA: Insufficient documentation

## 2022-04-29 DIAGNOSIS — R202 Paresthesia of skin: Secondary | ICD-10-CM | POA: Insufficient documentation

## 2022-04-29 DIAGNOSIS — R0789 Other chest pain: Secondary | ICD-10-CM | POA: Diagnosis not present

## 2022-04-29 MED ORDER — PREDNISONE 10 MG PO TABS: 30.0000 mg | ORAL_TABLET | Freq: Every day | ORAL | 0 refills | Status: DC

## 2022-04-29 MED ORDER — CYCLOBENZAPRINE HCL 10 MG PO TABS: 10.0000 mg | ORAL_TABLET | Freq: Two times a day (BID) | ORAL | 0 refills | Status: DC | PRN

## 2022-04-29 NOTE — ED Triage Notes (Signed)
Pt having left arm pain, numbness at fingertip, history of arthritis. ?

## 2022-04-29 NOTE — Discharge Instructions (Signed)
You have been seen here for shoulder pain I recommend taking over-the-counter pain medications like ibuprofen and/or Tylenol every 6 as needed.  Please follow dosage and on the back of bottle.  I also recommend applying heat to the area and stretching out the muscles as this will help decrease stiffness and pain.  I have given you information on exercises please follow. Given you Flexeril and steroids take as prescribed for pain.   ? ?Please follow-up with orthopedics for further evaluation. ? ?Come back to the emergency department if you develop chest pain, shortness of breath, severe abdominal pain, uncontrolled nausea, vomiting, diarrhea. ? ? ? ? ?

## 2022-04-29 NOTE — ED Provider Notes (Signed)
?[facility] ?Provider Note ? ? ?CSN: [id number] ?Arrival date & time: 04/29/22  1257 ? ?  ? ?History ? ?Chief Complaint  ?Patient presents with  ? Numbness  ?  Left arm numbness  ? ? ?Casey Larson is a 41 y.o. female. ? ?HPI ? ?Patient without significant medical history presents with complaints of left shoulder/arm pain.  Started on Tuesday, states pain is gradually gotten worse, states pain will come on randomly, will feel pain coming from her shoulder down to her arm, will occasionally feel paresthesias in her fingers, states the paresthesias are mainly between the webs of the index finger and thumb and sometimes between the web of the pinky and the ring finger.  She denies any traumatic injury to the area, has had no issues with her shoulder past no neck tenderness or neck pain no previous neck surgeries.  She denies any fevers chills chest pain peripheral edema.  She has no other complaints.  She states she been taking over-the-counter pain medication much relief.  She denies any any chest pain shortness of breath, she has no cardiac history, no history PEs or DVTs currently on hormone therapy, she has no cardiac risk factors. ? ?Home Medications ?Prior to Admission medications   ?Medication Sig Start Date End Date Taking? Authorizing Provider  ?cyclobenzaprine (FLEXERIL) 10 MG tablet Take 1 tablet (10 mg total) by mouth 2 (two) times daily as needed for muscle spasms. 04/29/22  Yes Carroll SageFaulkner, Bunyan Brier J, PA-C  ?predniSONE (DELTASONE) 10 MG tablet Take 3 tablets (30 mg total) by mouth daily. 04/29/22  Yes Carroll SageFaulkner, Moxie Kalil J, PA-C  ?acetaminophen (TYLENOL) 500 MG tablet Take 1,000 mg by mouth every 6 (six) hours as needed for moderate pain or headache.    [provider]  ?Naphazoline HCl (CLEAR EYES OP) Place 1 drop into both eyes daily.    [provider]  ?omeprazole (PRILOSEC) 40 MG capsule 1 tab po bid ?Patient taking differently: Take 40 mg by mouth daily. 05/27/19    Saguier, Ramon DredgeEdward, PA-C  ?Polyvinyl Alcohol-Povidone (REFRESH OP) Place 1 drop into both eyes daily.    [provider]  ?   ? ?Allergies    ?Codeine and Penicillins   ? ?Review of Systems   ?Review of Systems  ?Constitutional:  Negative for chills and fever.  ?Respiratory:  Negative for shortness of breath.   ?Cardiovascular:  Negative for chest pain.  ?Gastrointestinal:  Negative for abdominal pain.  ?Neurological:  Negative for headaches.  ? ?Physical Exam ?Updated Vital Signs ?BP 118/84 (BP Location: Right Arm)   Pulse (!) 112   Temp 98 ?F (36.7 ?C) (Oral)   Resp 16   Ht 5\' 4"  (1.626 m)   Wt 63.5 kg   LMP 04/15/2022 (Approximate)   SpO2 100%   BMI 24.03 kg/m?  ?Physical Exam ?Vitals and nursing note reviewed.  ?Constitutional:   ?   General: She is not in acute distress. ?   Appearance: She is not ill-appearing.  ?HENT:  ?   Head: Normocephalic and atraumatic.  ?   Nose: No congestion.  ?Eyes:  ?   Conjunctiva/sclera: Conjunctivae normal.  ?Cardiovascular:  ?   Rate and Rhythm: Normal rate and regular rhythm.  ?   Pulses: Normal pulses.  ?   Heart sounds: No murmur heard. ?  No friction rub. No gallop.  ?Pulmonary:  ?   Effort: No respiratory distress.  ?   Breath sounds: No wheezing, rhonchi or rales.  ?  Musculoskeletal:  ?   Comments: Spine was palpated was nontender to palpation no step-off or deformities noted.  Patient's left shoulder was palpated generalized pain around the deltoid, no overlying skin changes, she has full range of motion her fingers wrist elbow and shoulder, 2+ radial pulses, 2+ capillary refills, noted skin changes.  ?Skin: ?   General: Skin is warm and dry.  ?Neurological:  ?   Mental Status: She is alert.  ?Psychiatric:     ?   Mood and Affect: Mood normal.  ? ? ?ED Results / Procedures / Treatments   ?Labs ?(all labs ordered are listed, but only abnormal results are displayed) ?Labs Reviewed - No data to display ? ?EKG ?EKG Interpretation ? ?Date/Time:  Monday Apr 29 2022  13:21:36 EDT ?Ventricular Rate:  101 ?PR Interval:  142 ?QRS Duration: 78 ?QT Interval:  330 ?QTC Calculation: 427 ?R Axis:   78 ?Text Interpretation: Sinus tachycardia Otherwise normal ECG When compared with ECG of 26-Feb-2021 15:27, PREVIOUS ECG IS PRESENT Since last tracing rate faster Otherwise no significant change Confirmed by Mancel Bale [phone]) on 04/29/2022 2:25:10 PM ? ?Radiology ?No results found. ? ?Procedures ?Procedures  ? ? ?Medications Ordered in ED ?Medications - No data to display ? ?ED Course/ Medical Decision Making/ A&P ?  ?                        ?Medical Decision Making ? ?This patient presents to the ED for concern of left arm pain, this involves an extensive number of treatment options, and is a complaint that carries with it a high risk of complications and morbidity.  The differential diagnosis includes fracture, dislocation, compartment syndrome, spinal stenosis ? ? ? ?Additional history obtained: ? ?Additional history obtained from N/A ?External records from outside source obtained and reviewed including previous ER notes, PCP notes imaging ? ? ?Co morbidities that complicate the patient evaluation ? ?Arthritis ? ?Social Determinants of Health: ? ?N/A ? ? ? ?Lab Tests: ? ?I Ordered, and personally interpreted labs.  The pertinent results include: N/A ? ? ?Imaging Studies ordered: ? ?I ordered imaging studies including N/A ?I independently visualized and interpreted imaging which showed N/A ?I agree with the radiologist interpretation ? ? ?Cardiac Monitoring: ? ?The patient was maintained on a cardiac monitor.  I personally viewed and interpreted the cardiac monitored which showed an underlying rhythm of: N/A ? ? ?Medicines ordered and prescription drug management: ? ?I ordered medication including N/A ?I have reviewed the patients home medicines and have made adjustments as needed ? ?Critical Interventions: ? ?N/A ? ? ?Reevaluation: ? ?Presents with left shoulder pain, she has a benign  physical exam, no traumatic injury associated with this, recommend Flexeril, steroids, follow-up with orthopedics for further evaluation patient given this plan ? ?Consultations Obtained: ? ?N/A ? ? ?Test Considered: ? ?X-ray of left shoulder-with shared decision making this was deferred as patient not feel there is anything broken, I find this acceptable as I also doubt there is any fracture or dislocation at this time no traumatic injury associated with this pain, she is not at an increased risk factors pathological fractures. ? ? ? ?Rule out ?I have low suspicion for septic arthritis as patient denies IV drug use, skin exam was performed no erythematous, edematous, warm joints noted on exam, no new heart murmur heard on exam.  low suspicion for ligament or tendon damage as area was palpated no gross defects noted, they had  full range of motion as well as 5/5 strength.  Low suspicion for compartment syndrome as area was palpated it was soft to the touch, neurovascular fully intact.  Low suspicion for cervical spine stenosis as she is having no neck tenderness, she is having no focal deficits on my exam.  Low suspicion for ACS as presentation is atypical, pain is worsened with movement describes paresthesias down the arm without chest pain or shortness of breath.  She has low cardiac risk factors. ? ? ? ? ?Dispostion and problem list ? ?After consideration of the diagnostic results and the patients response to treatment, I feel that the patent would benefit from discharge. ? ?Left arm pain-likely musculoskeletal left shoulder, will provide a short course of steroids, muscle relaxers, follow-up with orthopedics for further evaluation and strict return precautions. ? ? ? ? ? ? ? ? ? ? ? ?Final Clinical Impression(s) / ED Diagnoses ?Final diagnoses:  ?Acute pain of left shoulder  ? ? ?Rx / DC Orders ?ED Discharge Orders   ? ?      Ordered  ?  predniSONE (DELTASONE) 10 MG tablet  Daily       ? 04/29/22 1521  ?   cyclobenzaprine (FLEXERIL) 10 MG tablet  2 times daily PRN       ? 04/29/22 1521  ? ?  ?  ? ?  ? ? ?  ?Carroll Sage, PA-C ?04/29/22 1523 ? ?  ?Mancel Bale, MD ?04/30/22 1421 ? ?

## 2022-05-02 ENCOUNTER — Emergency Department (HOSPITAL_COMMUNITY)
Admission: EM | Admit: 2022-05-02 | Discharge: 2022-05-02 | Disposition: A | Discharge status: Home / Self Care | Payer: 59 | Attending: Emergency Medicine | Admitting: Emergency Medicine

## 2022-05-02 ENCOUNTER — Emergency Department (HOSPITAL_COMMUNITY): Payer: 59

## 2022-05-02 ENCOUNTER — Other Ambulatory Visit: Payer: Self-pay

## 2022-05-02 ENCOUNTER — Encounter (HOSPITAL_COMMUNITY): Payer: Self-pay

## 2022-05-02 DIAGNOSIS — R0789 Other chest pain: Secondary | ICD-10-CM | POA: Insufficient documentation

## 2022-05-02 DIAGNOSIS — R079 Chest pain, unspecified: Secondary | ICD-10-CM | POA: Diagnosis not present

## 2022-05-02 DIAGNOSIS — M79602 Pain in left arm: Secondary | ICD-10-CM | POA: Insufficient documentation

## 2022-05-02 DIAGNOSIS — M25512 Pain in left shoulder: Secondary | ICD-10-CM | POA: Diagnosis not present

## 2022-05-02 DIAGNOSIS — R002 Palpitations: Secondary | ICD-10-CM | POA: Diagnosis not present

## 2022-05-02 LAB — COMPREHENSIVE METABOLIC PANEL
ALT: 9 U/L (ref 0–44)
AST: 14 U/L — ABNORMAL LOW (ref 15–41)
Albumin: 4 g/dL (ref 3.5–5.0)
Alkaline Phosphatase: 62 U/L (ref 38–126)
Anion gap: 5 (ref 5–15)
BUN: 9 mg/dL (ref 6–20)
CO2: 25 mmol/L (ref 22–32)
Calcium: 8.8 mg/dL — ABNORMAL LOW (ref 8.9–10.3)
Chloride: 108 mmol/L (ref 98–111)
Creatinine, Ser: 0.72 mg/dL (ref 0.44–1.00)
GFR, Estimated: >60 mL/min (ref >60)
Glucose, Bld: 97 mg/dL (ref 70–99)
Potassium: 4 mmol/L (ref 3.5–5.1)
Sodium: 138 mmol/L (ref 135–145)
Total Bilirubin: 0.5 mg/dL (ref 0.3–1.2)
Total Protein: 7.6 g/dL (ref 6.5–8.1)

## 2022-05-02 LAB — CBC WITH DIFFERENTIAL/PLATELET
Abs Immature Granulocytes: 0.02 10*3/uL (ref 0.00–0.07)
Basophils Absolute: 0 10*3/uL (ref 0.0–0.1)
Basophils Relative: 0 %
Eosinophils Absolute: 0 10*3/uL (ref 0.0–0.5)
Eosinophils Relative: 1 %
HCT: 38.7 % (ref 36.0–46.0)
Hemoglobin: 13.1 g/dL (ref 12.0–15.0)
Immature Granulocytes: 0 %
Lymphocytes Relative: 29 %
Lymphs Abs: 1.7 10*3/uL (ref 0.7–4.0)
MCH: 30.4 pg (ref 26.0–34.0)
MCHC: 33.9 g/dL (ref 30.0–36.0)
MCV: 89.8 fL (ref 80.0–100.0)
Monocytes Absolute: 0.3 10*3/uL (ref 0.1–1.0)
Monocytes Relative: 6 %
Neutro Abs: 3.7 10*3/uL (ref 1.7–7.7)
Neutrophils Relative %: 64 %
Platelets: 267 10*3/uL (ref 150–400)
RBC: 4.31 MIL/uL (ref 3.87–5.11)
RDW: 13.2 % (ref 11.5–15.5)
WBC: 5.9 10*3/uL (ref 4.0–10.5)
nRBC: 0 % (ref 0.0–0.2)

## 2022-05-02 LAB — D-DIMER, QUANTITATIVE: D-Dimer, Quant: 0.37 ug/mL-FEU (ref 0.00–0.50)

## 2022-05-02 LAB — TROPONIN I (HIGH SENSITIVITY)
Troponin I (High Sensitivity): <2 ng/L (ref <18)
Troponin I (High Sensitivity): <2 ng/L (ref <18)

## 2022-05-02 LAB — LIPASE, BLOOD: Lipase: 34 U/L (ref 11–51)

## 2022-05-02 MED ORDER — ALUM & MAG HYDROXIDE-SIMETH 200-200-20 MG/5ML PO SUSP
30.0000 mL | Freq: Once | ORAL | Status: AC
  Administered 2022-05-02: 30 mL via ORAL
  Filled 2022-05-02: qty 30

## 2022-05-02 MED ORDER — KETOROLAC TROMETHAMINE 30 MG/ML IJ SOLN
15.0000 mg | Freq: Once | INTRAMUSCULAR | Status: AC
  Administered 2022-05-02: 15 mg via INTRAVENOUS
  Filled 2022-05-02: qty 1

## 2022-05-02 MED ORDER — HYDROMORPHONE HCL 1 MG/ML IJ SOLN
0.5000 mg | Freq: Once | INTRAMUSCULAR | Status: DC
  Filled 2022-05-02: qty 0.5

## 2022-05-02 NOTE — ED Triage Notes (Signed)
Patient arrived with complaints of chest tightness, palpitations, and left arm weakness. Patient states she has not had relief since she was last seen here. ?

## 2022-05-02 NOTE — Discharge Instructions (Signed)
Shoulder pain-this is likely muscular in nature, please continue with over-the-counter pain medications, follow-up with orthopedics for further evaluation. ?Left-sided chest pain-suspect this is more acid reflux, please continue with your omeprazole, please, please follow-up with GI for further evaluation. ? ?Come back to the emergency department if you develop chest pain, shortness of breath, severe abdominal pain, uncontrolled nausea, vomiting, diarrhea. ? ?

## 2022-05-02 NOTE — ED Provider Notes (Signed)
?[facility] ?Provider Note ? ? ?CSN: [id number] ?Arrival date & time: 05/02/22  1941 ? ?  ? ?History ? ?Chief Complaint  ?Patient presents with  ? Chest Pain  ? ? ?Casey Larson is a 41 y.o. female. ? ?HPI ? ?Without significant medical history presents with complaints of left shoulder pain and as well as chest tightness.  Patient states that she was seen few days ago in the ER but her symptoms have not improved.  She states that she feels chest tightness mainly on the left side of her chest, this has remained constant, she states it does not radiate, and also felt like she had some chest palpitations, she denies shortness of breath, pleuritic chest pain, not worsened with exertion, she has no history of PEs or DVTs currently not on hormone therapy, she has no cardiac history, she denies illicit drug use, does not smoke, no recent surgeries, she does notice that she went on a long car ride last Thursday but is not endorsing any calf pain or unilateral leg swelling.  She states the left shoulder pain is is a constant dull-like sensation, it is worse with movement will block occasional paresthesias moving down her left arm, no recent trauma to the area.  She also notes that she is having some burning-like sensation on the left side of her chest states she will happen randomly will last 1 to 2 seconds and resolved.  She is currently on omeprazole but is unsure if this is really working. ? ?Reviewed patient's chart was seen on the eighth, she had a benign physical exam, and was sent home on steroids and Flexeril. ? ?Home Medications ?Prior to Admission medications   ?Medication Sig Start Date End Date Taking? Authorizing Provider  ?acetaminophen (TYLENOL) 500 MG tablet Take 1,000 mg by mouth every 6 (six) hours as needed for moderate pain or headache.   Yes [provider]  ?omeprazole (PRILOSEC) 40 MG capsule 1 tab po bid ?Patient taking differently: Take 20-60 mg by mouth daily. 20mg   in the morning and 40mg  in the evening 05/27/19  Yes Saguier, , PA-C  ?cyclobenzaprine (FLEXERIL) 10 MG tablet Take 1 tablet (10 mg total) by mouth 2 (two) times daily as needed for muscle spasms. ?Patient not taking: Reported on 05/02/2022 04/29/22   07/02/2022, PA-C  ?predniSONE (DELTASONE) 10 MG tablet Take 3 tablets (30 mg total) by mouth daily. ?Patient not taking: Reported on 05/02/2022 04/29/22   07/02/2022, PA-C  ?   ? ?Allergies    ?Codeine and Penicillins   ? ?Review of Systems   ?Review of Systems  ?Constitutional:  Negative for chills and fever.  ?Respiratory:  Positive for chest tightness. Negative for shortness of breath.   ?Cardiovascular:  Negative for chest pain.  ?Gastrointestinal:  Negative for abdominal pain.  ?Musculoskeletal:   ?     Left shoulder pain ?  ?Neurological:  Negative for headaches.  ? ?Physical Exam ?Updated Vital Signs ?BP 109/69   Pulse 70   Resp 12   Ht 5\' 4"  (1.626 m)   Wt 63.5 kg   LMP 04/15/2022 (Approximate)   SpO2 100%   BMI 24.03 kg/m?  ?Physical Exam ?Vitals and nursing note reviewed.  ?Constitutional:   ?   General: She is not in acute distress. ?   Appearance: She is not ill-appearing.  ?HENT:  ?   Head: Normocephalic and atraumatic.  ?   Nose: No congestion.  ?Eyes:  ?  Conjunctiva/sclera: Conjunctivae normal.  ?Cardiovascular:  ?   Rate and Rhythm: Normal rate and regular rhythm.  ?   Pulses: Normal pulses.  ?   Heart sounds: No murmur heard. ?  No friction rub. No gallop.  ?Pulmonary:  ?   Effort: No respiratory distress.  ?   Breath sounds: No wheezing, rhonchi or rales.  ?   Comments: Patient's chest pain was reproducible on the left side, there is no crepitus or deformities noted, there is no focalized tenderness there is generalized tenderness on the left side. ?Chest:  ?   Chest wall: Tenderness present.  ?Abdominal:  ?   Palpations: Abdomen is soft.  ?   Tenderness: There is abdominal tenderness. There is no right CVA tenderness or left  CVA tenderness.  ?   Comments: Abdomen nondistended, had noted epigastric tenderness, without guarding rebound as or peritoneal sign no Murphy sign McBurney point.  ?Musculoskeletal:  ?   Right lower leg: No edema.  ?   Left lower leg: No edema.  ?Skin: ?   General: Skin is warm and dry.  ?Neurological:  ?   Mental Status: She is alert.  ?Psychiatric:     ?   Mood and Affect: Mood normal.  ? ? ?ED Results / Procedures / Treatments   ?Labs ?(all labs ordered are listed, but only abnormal results are displayed) ?Labs Reviewed  ?COMPREHENSIVE METABOLIC PANEL - Abnormal; Notable for the following components:  ?    Result Value  ? Calcium 8.8 (*)   ? AST 14 (*)   ? All other components within normal limits  ?CBC WITH DIFFERENTIAL/PLATELET  ?LIPASE, BLOOD  ?D-DIMER, QUANTITATIVE  ?TROPONIN I (HIGH SENSITIVITY)  ?TROPONIN I (HIGH SENSITIVITY)  ? ? ?EKG ?EKG Interpretation ? ?Date/Time:  Thursday May 02 2022 09:50:46 EDT ?Ventricular Rate:  90 ?PR Interval:  146 ?QRS Duration: 80 ?QT Interval:  361 ?QTC Calculation: 442 ?R Axis:   73 ?Text Interpretation: Sinus rhythm Low voltage, precordial leads Confirmed by Vanetta Mulders [phone]) on 05/02/2022 9:56:25 AM ? ?Radiology ?DG Chest 2 View ? ?Result Date: 05/02/2022 ?CLINICAL DATA:  Chest pain, left shoulder pain, palpitations EXAM: CHEST - 2 VIEW COMPARISON:  02/26/2021 FINDINGS: The heart size and mediastinal contours are within normal limits. Both lungs are clear. The visualized skeletal structures are unremarkable. IMPRESSION: No active cardiopulmonary disease. Electronically Signed   By: Ernie Avena M.D.   On: 05/02/2022 10:52  ? ?DG Shoulder Left ? ?Result Date: 05/02/2022 ?CLINICAL DATA:  Chest pain LEFT shoulder pain. EXAM: LEFT SHOULDER - 2+ VIEW COMPARISON:  Chest evaluation of the same date. FINDINGS: EKG leads project over the visualized chest. No sign of acute fracture or dislocation. Soft tissues are unremarkable. IMPRESSION: No acute findings.  Electronically Signed   By: Donzetta Kohut M.D.   On: 05/02/2022 10:54   ? ?Procedures ?Procedures  ? ? ?Medications Ordered in ED ?Medications  ?alum & mag hydroxide-simeth (MAALOX/MYLANTA) 200-200-20 MG/5ML suspension 30 mL (30 mLs Oral Given 05/02/22 1053)  ?ketorolac (TORADOL) 30 MG/ML injection 15 mg (15 mg Intravenous Given 05/02/22 1407)  ? ? ?ED Course/ Medical Decision Making/ A&P ?  ?                        ?Medical Decision Making ?Amount and/or Complexity of Data Reviewed ?Labs: ordered. ?Radiology: ordered. ? ?Risk ?OTC drugs. ?Prescription drug management. ? ? ?This patient presents to the ED for concern of chest pain left  arm pain, this involves an extensive number of treatment options, and is a complaint that carries with it a high risk of complications and morbidity.  The differential diagnosis includes muscular strain, GERD, PE, ACS, ? ? ? ?Additional history obtained: ? ?Additional history obtained from partner at bedside ?External records from outside source obtained and reviewed including ER notes, lab work, imaging, medical record ? ? ?Co morbidities that complicate the patient evaluation ? ?N/A ? ?Social Determinants of Health: ? ?N/A ? ? ? ?Lab Tests: ? ?I Ordered, and personally interpreted labs.  The pertinent results include: CBC unremarkable, CMP shows calcium 8.8, AST 14, lipase is 34, D-dimer is 0.37 negative delta troponin ? ? ?Imaging Studies ordered: ? ?I ordered imaging studies including chest x-ray, x-ray of left shoulder ?I independently visualized and interpreted imaging which showed negative acute findings ?I agree with the radiologist interpretation ? ? ?Cardiac Monitoring: ? ?The patient was maintained on a cardiac monitor.  I personally viewed and interpreted the cardiac monitored which showed an underlying rhythm of: EKG without signs of ischemia ? ? ?Medicines ordered and prescription drug management: ? ?I ordered medication including GI cocktail ?I have reviewed the patients  home medicines and have made adjustments as needed ? ?Critical Interventions: ? ?N/A ? ? ?Reevaluation: ? ?Presents with left arm pain and chest tightness, she has a benign physical exam, will obtain basic lab work-up

## 2022-05-02 NOTE — ED Notes (Signed)
Pt gone to xray

## 2022-07-04 ENCOUNTER — Emergency Department (HOSPITAL_COMMUNITY)
Admission: EM | Admit: 2022-07-04 | Discharge: 2022-07-05 | Disposition: A | Discharge status: Home / Self Care | Payer: 59 | Attending: Emergency Medicine | Admitting: Emergency Medicine

## 2022-07-04 ENCOUNTER — Emergency Department (HOSPITAL_COMMUNITY): Payer: 59

## 2022-07-04 ENCOUNTER — Encounter (HOSPITAL_COMMUNITY): Payer: Self-pay

## 2022-07-04 DIAGNOSIS — M25512 Pain in left shoulder: Secondary | ICD-10-CM | POA: Diagnosis not present

## 2022-07-04 DIAGNOSIS — R079 Chest pain, unspecified: Secondary | ICD-10-CM | POA: Diagnosis not present

## 2022-07-04 DIAGNOSIS — R0602 Shortness of breath: Secondary | ICD-10-CM | POA: Insufficient documentation

## 2022-07-04 DIAGNOSIS — R000 Tachycardia, unspecified: Secondary | ICD-10-CM | POA: Insufficient documentation

## 2022-07-04 DIAGNOSIS — M542 Cervicalgia: Secondary | ICD-10-CM | POA: Insufficient documentation

## 2022-07-04 DIAGNOSIS — M79602 Pain in left arm: Secondary | ICD-10-CM | POA: Insufficient documentation

## 2022-07-04 DIAGNOSIS — R002 Palpitations: Secondary | ICD-10-CM

## 2022-07-04 LAB — BASIC METABOLIC PANEL
Anion gap: 10 (ref 5–15)
BUN: 10 mg/dL (ref 6–20)
CO2: 21 mmol/L — ABNORMAL LOW (ref 22–32)
Calcium: 8.8 mg/dL — ABNORMAL LOW (ref 8.9–10.3)
Chloride: 107 mmol/L (ref 98–111)
Creatinine, Ser: 0.78 mg/dL (ref 0.44–1.00)
GFR, Estimated: >60 mL/min (ref >60)
Glucose, Bld: 94 mg/dL (ref 70–99)
Potassium: 3.8 mmol/L (ref 3.5–5.1)
Sodium: 138 mmol/L (ref 135–145)

## 2022-07-04 LAB — CBC
HCT: 36.7 % (ref 36.0–46.0)
Hemoglobin: 12.2 g/dL (ref 12.0–15.0)
MCH: 30 pg (ref 26.0–34.0)
MCHC: 33.2 g/dL (ref 30.0–36.0)
MCV: 90.4 fL (ref 80.0–100.0)
Platelets: 300 10*3/uL (ref 150–400)
RBC: 4.06 MIL/uL (ref 3.87–5.11)
RDW: 13.2 % (ref 11.5–15.5)
WBC: 7.6 10*3/uL (ref 4.0–10.5)
nRBC: 0 % (ref 0.0–0.2)

## 2022-07-04 LAB — TROPONIN I (HIGH SENSITIVITY)
Troponin I (High Sensitivity): <2 ng/L (ref <18)
Troponin I (High Sensitivity): 3 ng/L (ref <18)

## 2022-07-04 NOTE — ED Triage Notes (Signed)
Pt states that she has been having palpations since Sat, worse today, denies SOB and L arm pain.

## 2022-07-04 NOTE — ED Provider Triage Note (Signed)
Emergency Medicine Provider Triage Evaluation Note  Casey Larson , a 41 y.o. female  was evaluated in triage.  Pt complains of palpitations onset 6 days.  Denies recent fall, injury, trauma.  Patient is that she has chest pain that is intermittent to the left as well as left arm pain.  No meds tried prior to arrival.  Denies past medical history of MI, cardiac catheterization, stents.  Denies history of diabetes or hypertension.  Denies vomiting, fever.  Notes that she feels nauseated whenever the pressure sensation begins in her chest.  Review of Systems  Positive: As per HPI Negative:   Physical Exam  BP 125/72   Pulse 98   Temp 98.4 F (36.9 C) (Oral)   Resp 16   SpO2 100%  Gen:   Awake, no distress  Resp:  Normal effort  MSK:   Moves extremities without difficulty Other:   Medical Decision Making  Medically screening exam initiated at 7:05 PM.  Appropriate orders placed.  ALEXSUS PAPADOPOULOS was informed that the remainder of the evaluation will be completed by another provider, this initial triage assessment does not replace that evaluation, and the importance of remaining in the ED until their evaluation is complete.  Work-up initiated   Eliazar Olivar A, PA-C 07/04/22 1910

## 2022-07-05 LAB — D-DIMER, QUANTITATIVE: D-Dimer, Quant: 0.34 ug/mL-FEU (ref 0.00–0.50)

## 2022-07-05 NOTE — ED Provider Notes (Signed)
[facility] EMERGENCY DEPARTMENT Provider Note   CSN: [id number] Arrival date & time: 07/04/22  1823     History  Chief Complaint  Patient presents with   Chest Pain    Casey Larson is a 41 y.o. female.  The history is provided by the patient and the spouse.  Palpitations Palpitations quality:  Fast Onset quality:  Sudden Timing:  Intermittent Progression:  Waxing and waning Chronicity:  Recurrent Relieved by:  Nothing Associated symptoms: shortness of breath   Associated symptoms: no syncope   Risk factors: no heart disease, no hx of atrial fibrillation, no hx of DVT, no hx of PE and no hx of thyroid disease    Patient with history of esophageal reflux presents with palpitations.  She reports this began at rest several days ago She reports she feels her heart racing and then will have shortness of breath.  She also have some left shoulder and left arm pain.  No syncope.  No fevers or vomiting.  No previous history of CAD/VTE She will have intermittent neck pain at times into her left shoulder. She does not take any birth control.  She is a non-smoker.  No excessive caffeine use.  No drug use No previous history of exertional syncope.  No family history of sudden death.  She reports her father does have CAD Past Medical History:  Diagnosis Date   Abdominal pain    epigastric pain to left side chest,back,arm   Acid reflux    Arthritis    as a child   History of hiatal hernia     Home Medications Prior to Admission medications   Medication Sig Start Date End Date Taking? Authorizing Provider  acetaminophen (TYLENOL) 500 MG tablet Take 1,000 mg by mouth every 6 (six) hours as needed for moderate pain or headache.    [provider]  cyclobenzaprine (FLEXERIL) 10 MG tablet Take 1 tablet (10 mg total) by mouth 2 (two) times daily as needed for muscle spasms. Patient not taking: Reported on 05/02/2022 04/29/22   Carroll Sage, PA-C   omeprazole (PRILOSEC) 40 MG capsule 1 tab po bid Patient taking differently: Take 20-60 mg by mouth daily. 20mg  in the morning and 40mg  in the evening 05/27/19   Saguier, , PA-C  predniSONE (DELTASONE) 10 MG tablet Take 3 tablets (30 mg total) by mouth daily. Patient not taking: Reported on 05/02/2022 04/29/22   07/02/2022, PA-C      Allergies    Codeine and Penicillins    Review of Systems   Review of Systems  Constitutional:  Negative for fever.  Respiratory:  Positive for shortness of breath.   Cardiovascular:  Positive for palpitations. Negative for syncope.  Neurological:  Negative for syncope.    Physical Exam Updated Vital Signs BP 100/69   Pulse 72   Temp 98.1 F (36.7 C) (Oral)   Resp (!) 21   SpO2 100%  Physical Exam CONSTITUTIONAL: Well developed/well nourished, appears mildly anxious HEAD: Normocephalic/atraumatic EYES: EOMI/PERRL ENMT: Mucous membranes moist NECK: supple no meningeal signs SPINE/BACK:entire spine nontender CV: S1/S2 noted, no murmurs/rubs/gallops noted LUNGS: Lungs are clear to auscultation bilaterally, no apparent distress ABDOMEN: soft, nontender, no rebound or guarding, bowel sounds noted throughout abdomen GU:no cva tenderness NEURO: Pt is awake/alert/appropriate, moves all extremitiesx4.  No facial droop.   EXTREMITIES: pulses normal/equal, full ROM, no calf tenderness or edema SKIN: warm, color normal PSYCH: Mildly anxious  ED Results / Procedures / Treatments  Labs (all labs ordered are listed, but only abnormal results are displayed) Labs Reviewed  BASIC METABOLIC PANEL - Abnormal; Notable for the following components:      Result Value   CO2 21 (*)    Calcium 8.8 (*)    All other components within normal limits  CBC  D-DIMER, QUANTITATIVE  TROPONIN I (HIGH SENSITIVITY)  TROPONIN I (HIGH SENSITIVITY)    EKG EKG Interpretation  Date/Time:  Thursday July 04 2022 18:39:11 EDT Ventricular Rate:  106 PR  Interval:  126 QRS Duration: 76 QT Interval:  320 QTC Calculation: 425 R Axis:   72 Text Interpretation: Sinus tachycardia Nonspecific ST abnormality Abnormal ECG Interpretation limited secondary to artifact Confirmed by Zadie Rhine ([id number]) on 07/05/2022 5:07:48 AM    EKG Interpretation  Date/Time:  Friday July 05 2022 05:30:26 EDT Ventricular Rate:  70 PR Interval:  141 QRS Duration: 92 QT Interval:  401 QTC Calculation: 433 R Axis:   63 Text Interpretation: Sinus rhythm Confirmed by Zadie Rhine ([id number]) on 07/05/2022 5:57:24 AM        Radiology DG Chest 1 View  Result Date: 07/04/2022 CLINICAL DATA:  Chest pain EXAM: CHEST  1 VIEW COMPARISON:  05/02/2022 FINDINGS: The heart size and mediastinal contours are within normal limits. Both lungs are clear. The visualized skeletal structures are unremarkable. IMPRESSION: Normal study. Electronically Signed   By: Charlett Nose M.D.   On: 07/04/2022 20:13    Procedures Procedures    Medications Ordered in ED Medications - No data to display  ED Course/ Medical Decision Making/ A&P Clinical Course as of 07/05/22 0648  Fri Jul 05, 2022  0559 Patient had multiple ER visits previously for palpitations and chest pain.  Her initial EKG tonight showed sinus tachycardia with a nonspecific ST abnormality likely due to heart rate.  Repeat EKG at 5:30 AM reveals normal sinus rhythm at 70 bpm [DW]  0559 Due to earlier tachycardia, will obtain D-dimer to rule out PE.  If this is negative, she will be referred to cardiology for outpatient monitoring. [DW]  0981 D-dimer negative.  No dysrhythmia on EKG or cardiac monitor.  Low suspicion for ACS/PE at this time.  She is safe for outpatient management and may benefit from outpatient cardiac monitor [DW]    Clinical Course User Index [DW] Zadie Rhine, MD                           Medical Decision Making Amount and/or Complexity of Data Reviewed Labs: ordered. ECG/medicine tests:  ordered.   This patient presents to the ED for concern of palpitation, this involves an extensive number of treatment options, and is a complaint that carries with it a high risk of complications and morbidity.  The differential diagnosis includes but is not limited to SVT, atrial fibrillation, atrial flutter, sinus tachycardia, anxiety, PE, cardiac arrhythmia   Additional history obtained: Additional history obtained from spouse   Lab Tests: I Ordered, and personally interpreted labs.  The pertinent results include: Labs overall unremarkable  Imaging Studies ordered: I ordered imaging studies including X-ray chest   I independently visualized and interpreted imaging which showed no acute finding I agree with the radiologist interpretation  Cardiac Monitoring: The patient was maintained on a cardiac monitor.  I personally viewed and interpreted the cardiac monitor which showed an underlying rhythm of:  sinus rhythm   Test Considered: Patient is low risk / negative by Wells criteria, therefore do  not feel that CT chest is indicated.   Reevaluation: After the interventions noted above, I reevaluated the patient and found that they have :improved  Complexity of problems addressed: Patient's presentation is most consistent with  acute presentation with potential threat to life or bodily function  Disposition: After consideration of the diagnostic results and the patient's response to treatment,  I feel that the patent would benefit from discharge   .           Final Clinical Impression(s) / ED Diagnoses Final diagnoses:  Palpitations    Rx / DC Orders ED Discharge Orders          Ordered    Ambulatory referral to Cardiology       Comments: If you have not heard from the Cardiology office within the next 72 hours please call [phone].   07/05/22 5027              Zadie Rhine, MD 07/05/22 [phone]

## 2022-07-12 ENCOUNTER — Ambulatory Visit: Payer: 59 | Admitting: Internal Medicine

## 2022-08-22 ENCOUNTER — Ambulatory Visit: Payer: 59 | Attending: Internal Medicine | Admitting: Cardiology

## 2022-08-22 ENCOUNTER — Encounter: Payer: Self-pay | Admitting: Cardiology

## 2022-08-22 ENCOUNTER — Telehealth: Payer: Self-pay | Admitting: Cardiology

## 2022-08-22 ENCOUNTER — Ambulatory Visit: Payer: 59 | Attending: Cardiology

## 2022-08-22 ENCOUNTER — Other Ambulatory Visit: Payer: Self-pay | Admitting: Cardiology

## 2022-08-22 VITALS — BP 110/80 | HR 76 | Ht 64.5 in | Wt 136.2 lb

## 2022-08-22 DIAGNOSIS — R002 Palpitations: Secondary | ICD-10-CM

## 2022-08-22 NOTE — Patient Instructions (Addendum)
Medication Instructions:  Continue all current medications.   Labwork: none  Testing/Procedures: Your physician has recommended that you wear a 14 day event monitor. Event monitors are medical devices that record the heart's electrical activity. Doctors most often [place] these monitors to diagnose arrhythmias. Arrhythmias are problems with the speed or rhythm of the heartbeat. The monitor is a small, portable device. You can wear one while you do your normal daily activities. This is usually used to diagnose what is causing palpitations/syncope (passing out). Office will contact with results via phone, letter or mychart.     Follow-Up:  Pending test results   Any Other Special Instructions Will Be Listed Below (If Applicable).   If you need a refill on your cardiac medications before your next appointment, please call your pharmacy.

## 2022-08-22 NOTE — Progress Notes (Signed)
Clinical Summary Ms. Sellers is a 41 y.o.female seen today as a new patient for the following medical problems.   1.Palpitations - ER visit 07/05/22 - palpitations started in May - feeling of "hard thump", can feel "fluttery" as well - no specific trigger - lasts typically less <1 min - no other associated symptoms.   - coffee rare, mountain dew 1 can or 16 oz daily, no energy drink, no EtOH    2.GERD    SH: has a son 67 yo, daughter 34 yo, 70 yo daughter.      Past Medical History:  Diagnosis Date   Abdominal pain    epigastric pain to left side chest,back,arm   Acid reflux    Arthritis    as a child   History of hiatal hernia      Allergies  Allergen Reactions   Codeine     Syncope    Penicillins Hives and Rash    Did it involve swelling of the face/tongue/throat, SOB, or low BP? Unknown Did it involve sudden or severe rash/hives, skin peeling, or any reaction on the inside of your mouth or nose? Yes Did you need to seek medical attention at a hospital or doctor's office? Yes When did it last happen?      childhood allergy If all above answers are "NO", may proceed with cephalosporin use.       Current Outpatient Medications  Medication Sig Dispense Refill   acetaminophen (TYLENOL) 500 MG tablet Take 1,000 mg by mouth every 6 (six) hours as needed for moderate pain or headache.     cyclobenzaprine (FLEXERIL) 10 MG tablet Take 1 tablet (10 mg total) by mouth 2 (two) times daily as needed for muscle spasms. (Patient not taking: Reported on 05/02/2022) 20 tablet 0   omeprazole (PRILOSEC) 40 MG capsule 1 tab po bid (Patient taking differently: Take 20-60 mg by mouth daily. 20mg  in the morning and 40mg  in the evening) 60 capsule 6   No current facility-administered medications for this visit.     Past Surgical History:  Procedure Laterality Date   APPENDECTOMY     CESAREAN SECTION  3x last being 01/12   CHOLECYSTECTOMY N/A 07/09/2019   Procedure:  LAPAROSCOPIC CHOLECYSTECTOMY;  Surgeon: 03/12, MD;  Location: [facility] OR;  Service: General;  Laterality: N/A;   EYE SURGERY     lasik   LAPAROSCOPIC APPENDECTOMY N/A 12/31/2017   Procedure: APPENDECTOMY LAPAROSCOPIC;  Surgeon: Berna Bue, MD;  Location: [facility];  Service: General;  Laterality: N/A;   WISDOM TOOTH EXTRACTION       Allergies  Allergen Reactions   Codeine     Syncope    Penicillins Hives and Rash    Did it involve swelling of the face/tongue/throat, SOB, or low BP? Unknown Did it involve sudden or severe rash/hives, skin peeling, or any reaction on the inside of your mouth or nose? Yes Did you need to seek medical attention at a hospital or doctor's office? Yes When did it last happen?      childhood allergy If all above answers are "NO", may proceed with cephalosporin use.        Family History  Problem Relation Age of Onset   Diabetes Father    Cancer Maternal Grandmother        lung cancer   Breast cancer Maternal Aunt    Breast cancer Cousin    Colon cancer Neg Hx    Esophageal cancer Neg  Hx    Inflammatory bowel disease Neg Hx    Liver disease Neg Hx    Pancreatic cancer Neg Hx    Rectal cancer Neg Hx    Stomach cancer Neg Hx      Social History Ms. Bake reports that she has never smoked. She has never used smokeless tobacco. Ms. Franze reports no history of alcohol use.   Review of Systems CONSTITUTIONAL: No weight loss, fever, chills, weakness or fatigue.  HEENT: Eyes: No visual loss, blurred vision, double vision or yellow sclerae.No hearing loss, sneezing, congestion, runny nose or sore throat.  SKIN: No rash or itching.  CARDIOVASCULAR: per hpi RESPIRATORY: No shortness of breath, cough or sputum.  GASTROINTESTINAL: No anorexia, nausea, vomiting or diarrhea. No abdominal pain or blood.  GENITOURINARY: No burning on urination, no polyuria NEUROLOGICAL: No headache, dizziness, syncope, paralysis, ataxia, numbness or tingling  in the extremities. No change in bowel or bladder control.  MUSCULOSKELETAL: No muscle, back pain, joint pain or stiffness.  LYMPHATICS: No enlarged nodes. No history of splenectomy.  PSYCHIATRIC: No history of depression or anxiety.  ENDOCRINOLOGIC: No reports of sweating, cold or heat intolerance. No polyuria or polydipsia.  Marland Kitchen   Physical Examination Today's Vitals   08/22/22 1315  BP: 110/80  Pulse: 76  SpO2: 98%  Weight: 136 lb 3.2 oz (61.8 kg)  Height: 5' 4.5" (1.638 m)   Body mass index is 23.02 kg/m.  Gen: resting comfortably, no acute distress HEENT: no scleral icterus, pupils equal round and reactive, no palptable cervical adenopathy,  CV: RRR, no m/r/g, no jvd Resp: Clear to auscultation bilaterally GI: abdomen is soft, non-tender, non-distended, normal bowel sounds, no hepatosplenomegaly MSK: extremities are warm, no edema.  Skin: warm, no rash Neuro:  no focal deficits Psych: appropriate affect    Assessment and Plan  1.Palpitations - plan for 14 day zio patch to further evaluate - baseline EKG show NSR, normal intervals      Antoine Poche, M.D

## 2022-08-22 NOTE — Telephone Encounter (Signed)
Percert:  14 day event - palps

## 2022-09-12 DIAGNOSIS — R002 Palpitations: Secondary | ICD-10-CM | POA: Diagnosis not present

## 2022-11-18 ENCOUNTER — Encounter: Payer: Self-pay | Admitting: *Deleted

## 2024-02-12 ENCOUNTER — Emergency Department (HOSPITAL_COMMUNITY)
Admission: EM | Admit: 2024-02-12 | Discharge: 2024-02-13 | Disposition: A | Discharge status: Home / Self Care | Payer: 59 | Attending: Emergency Medicine | Admitting: Emergency Medicine

## 2024-02-12 ENCOUNTER — Emergency Department (HOSPITAL_COMMUNITY): Payer: Self-pay

## 2024-02-12 ENCOUNTER — Other Ambulatory Visit: Payer: Self-pay

## 2024-02-12 ENCOUNTER — Encounter (HOSPITAL_COMMUNITY): Payer: Self-pay | Admitting: Emergency Medicine

## 2024-02-12 DIAGNOSIS — R079 Chest pain, unspecified: Secondary | ICD-10-CM | POA: Diagnosis not present

## 2024-02-12 DIAGNOSIS — R002 Palpitations: Secondary | ICD-10-CM | POA: Diagnosis not present

## 2024-02-12 DIAGNOSIS — R0789 Other chest pain: Secondary | ICD-10-CM | POA: Diagnosis not present

## 2024-02-12 LAB — BASIC METABOLIC PANEL
Anion gap: 7 (ref 5–15)
BUN: 11 mg/dL (ref 6–20)
CO2: 21 mmol/L — ABNORMAL LOW (ref 22–32)
Calcium: 8.9 mg/dL (ref 8.9–10.3)
Chloride: 105 mmol/L (ref 98–111)
Creatinine, Ser: 0.74 mg/dL (ref 0.44–1.00)
GFR, Estimated: >60 mL/min (ref >60)
Glucose, Bld: 115 mg/dL — ABNORMAL HIGH (ref 70–99)
Potassium: 3.8 mmol/L (ref 3.5–5.1)
Sodium: 133 mmol/L — ABNORMAL LOW (ref 135–145)

## 2024-02-12 LAB — CBC
HCT: 41.3 % (ref 36.0–46.0)
Hemoglobin: 13.9 g/dL (ref 12.0–15.0)
MCH: 30.7 pg (ref 26.0–34.0)
MCHC: 33.7 g/dL (ref 30.0–36.0)
MCV: 91.2 fL (ref 80.0–100.0)
Platelets: 251 K/uL (ref 150–400)
RBC: 4.53 MIL/uL (ref 3.87–5.11)
RDW: 13.3 % (ref 11.5–15.5)
WBC: 9.4 K/uL (ref 4.0–10.5)
nRBC: 0 % (ref 0.0–0.2)

## 2024-02-12 LAB — TROPONIN I (HIGH SENSITIVITY): Troponin I (High Sensitivity): <2 ng/L (ref <18)

## 2024-02-12 NOTE — ED Triage Notes (Signed)
Pt was home sitting down and had a sudden onset left sided chest pain and heart palpitations started about 20 mins ago.

## 2024-02-12 NOTE — ED Notes (Signed)
 Patient transported to X-ray

## 2024-02-13 NOTE — Discharge Instructions (Addendum)
Follow-up with Dr. Wyline Mood in the next week.  Return to the ER if you develop worsening pain, recurrent prolonged palpitations, or for other new and concerning symptoms.

## 2024-02-13 NOTE — ED Provider Notes (Signed)
[place] EMERGENCY DEPARTMENT AT [facility] Provider Note   CSN: [id number] Arrival date & time: 02/12/24  2144     History  Chief Complaint  Patient presents with   Chest Pain   Palpitations    Casey Larson is a 43 y.o. female.  Patient is a 43 year old female with history of GERD and palpitations.  Patient presenting today with complaints of chest pain and racing heart.  She was watching TV this evening when she felt a sharp pain just to the left of her sternum.  This lasted a few seconds, then her heart began beating rapidly.  She describes a regular, but rapid heartbeat that lasted for approximately 1 minute, then resolved.  She denies any exertional symptoms.  Patient was seen by Dr. Wyline Mood a little over 1 year ago and wore an event monitor showing only PVCs.  The history is provided by the patient.       Home Medications Prior to Admission medications   Medication Sig Start Date End Date Taking? Authorizing Provider  ibuprofen (ADVIL) 200 MG tablet Take 200 mg by mouth every 6 (six) hours as needed.    [provider]  omeprazole (PRILOSEC) 20 MG capsule Take 20 mg by mouth 2 (two) times daily before a meal.    [provider]      Allergies    Codeine and Penicillins    Review of Systems   Review of Systems  All other systems reviewed and are negative.   Physical Exam Updated Vital Signs BP 120/74   Pulse (!) 114   Temp 97.6 F (36.4 C)   Resp 16   Ht 5\' 4"  (1.626 m)   Wt 61.8 kg   LMP 01/15/2024 (Approximate)   SpO2 100%   BMI 23.39 kg/m  Physical Exam Vitals and nursing note reviewed.  Constitutional:      General: She is not in acute distress.    Appearance: She is well-developed. She is not diaphoretic.  HENT:     Head: Normocephalic and atraumatic.  Cardiovascular:     Rate and Rhythm: Normal rate and regular rhythm.     Heart sounds: No murmur heard.    No friction rub. No gallop.  Pulmonary:      Effort: Pulmonary effort is normal. No respiratory distress.     Breath sounds: Normal breath sounds. No wheezing.  Abdominal:     General: Bowel sounds are normal. There is no distension.     Palpations: Abdomen is soft.     Tenderness: There is no abdominal tenderness.  Musculoskeletal:        General: Normal range of motion.     Cervical back: Normal range of motion and neck supple.  Skin:    General: Skin is warm and dry.  Neurological:     General: No focal deficit present.     Mental Status: She is alert and oriented to person, place, and time.     ED Results / Procedures / Treatments   Labs (all labs ordered are listed, but only abnormal results are displayed) Labs Reviewed  BASIC METABOLIC PANEL - Abnormal; Notable for the following components:      Result Value   Sodium 133 (*)    CO2 21 (*)    Glucose, Bld 115 (*)    All other components within normal limits  CBC  POC URINE PREG, ED  TROPONIN I (HIGH SENSITIVITY)    EKG ED ECG REPORT  Date: 02/13/2024  Rate: 100  Rhythm: normal sinus rhythm  QRS Axis: normal  Intervals: normal  ST/T Wave abnormalities: normal  Conduction Disutrbances:none  Narrative Interpretation:   Old EKG Reviewed: unchanged  I have personally reviewed the EKG tracing and agree with the computerized printout as noted.   Radiology DG Chest 2 View Result Date: 02/12/2024 CLINICAL DATA:  Chest pain EXAM: CHEST - 2 VIEW COMPARISON:  07/04/2022 FINDINGS: The heart size and mediastinal contours are within normal limits. Both lungs are clear. The visualized skeletal structures are unremarkable. IMPRESSION: No active cardiopulmonary disease. Electronically Signed   By: Jasmine Pang M.D.   On: 02/12/2024 22:27    Procedures Procedures    Medications Ordered in ED Medications - No data to display  ED Course/ Medical Decision Making/ A&P  Patient is a 43 year old female presenting with palpitations as described in the HPI.  Patient  arrives here with stable vital signs and is afebrile.  Physical examination is unremarkable.  At the time of my exam, her heart rate is in the 70s.  Laboratory studies obtained including CBC, metabolic panel, and troponin, all of which are normal.  Chest x-ray showing no active cardiopulmonary disease.  Patient presenting here after an episode of palpitations which was preceded by a sharp pain to the left side of her chest.  By the time she arrived here, both symptoms had resolved.  She arrives in a sinus rhythm with no acute changes on her EKG.  Her workup is unremarkable.  Patient has no risk factors and I highly doubt an acute coronary event.  She may have had a brief episode of tachycardia, the etiology of which I am uncertain.  I will have her follow back up with Dr. Wyline Mood if symptoms persist.  Perhaps another event monitor is in order.  Final Clinical Impression(s) / ED Diagnoses Final diagnoses:  None    Rx / DC Orders ED Discharge Orders     None         Geoffery Lyons, MD 02/13/24 [phone]

## 2024-03-18 DIAGNOSIS — Z0000 Encounter for general adult medical examination without abnormal findings: Secondary | ICD-10-CM | POA: Diagnosis not present

## 2024-07-20 ENCOUNTER — Encounter (HOSPITAL_COMMUNITY): Payer: Self-pay

## 2024-07-20 ENCOUNTER — Other Ambulatory Visit: Payer: Self-pay

## 2024-07-20 ENCOUNTER — Emergency Department (HOSPITAL_COMMUNITY)
Admission: EM | Admit: 2024-07-20 | Discharge: 2024-07-21 | Disposition: A | Discharge status: Home / Self Care | Attending: Emergency Medicine | Admitting: Emergency Medicine

## 2024-07-20 DIAGNOSIS — Z9049 Acquired absence of other specified parts of digestive tract: Secondary | ICD-10-CM | POA: Diagnosis not present

## 2024-07-20 DIAGNOSIS — R9431 Abnormal electrocardiogram [ECG] [EKG]: Secondary | ICD-10-CM | POA: Diagnosis not present

## 2024-07-20 DIAGNOSIS — R109 Unspecified abdominal pain: Secondary | ICD-10-CM | POA: Diagnosis not present

## 2024-07-20 DIAGNOSIS — R1013 Epigastric pain: Secondary | ICD-10-CM | POA: Insufficient documentation

## 2024-07-20 DIAGNOSIS — R1012 Left upper quadrant pain: Secondary | ICD-10-CM | POA: Diagnosis not present

## 2024-07-20 NOTE — ED Triage Notes (Addendum)
 Pt to ED from home with c/o heartburn since Saturday. Pt says she took Tums and it helped, but then heartburn comes back.

## 2024-07-21 ENCOUNTER — Emergency Department (HOSPITAL_COMMUNITY)

## 2024-07-21 DIAGNOSIS — Z9049 Acquired absence of other specified parts of digestive tract: Secondary | ICD-10-CM | POA: Diagnosis not present

## 2024-07-21 DIAGNOSIS — R109 Unspecified abdominal pain: Secondary | ICD-10-CM | POA: Diagnosis not present

## 2024-07-21 LAB — CBC WITH DIFFERENTIAL/PLATELET
Abs Immature Granulocytes: 0.03 K/uL (ref 0.00–0.07)
Basophils Absolute: 0 K/uL (ref 0.0–0.1)
Basophils Relative: 1 %
Eosinophils Absolute: 0.1 K/uL (ref 0.0–0.5)
Eosinophils Relative: 1 %
HCT: 35.3 % — ABNORMAL LOW (ref 36.0–46.0)
Hemoglobin: 12.3 g/dL (ref 12.0–15.0)
Immature Granulocytes: 0 %
Lymphocytes Relative: 28 %
Lymphs Abs: 2.4 K/uL (ref 0.7–4.0)
MCH: 30.7 pg (ref 26.0–34.0)
MCHC: 34.8 g/dL (ref 30.0–36.0)
MCV: 88 fL (ref 80.0–100.0)
Monocytes Absolute: 0.5 K/uL (ref 0.1–1.0)
Monocytes Relative: 5 %
Neutro Abs: 5.4 K/uL (ref 1.7–7.7)
Neutrophils Relative %: 65 %
Platelets: 287 K/uL (ref 150–400)
RBC: 4.01 MIL/uL (ref 3.87–5.11)
RDW: 13.5 % (ref 11.5–15.5)
WBC: 8.3 K/uL (ref 4.0–10.5)
nRBC: 0 % (ref 0.0–0.2)

## 2024-07-21 LAB — COMPREHENSIVE METABOLIC PANEL WITH GFR
ALT: 8 U/L (ref 0–44)
AST: 11 U/L — ABNORMAL LOW (ref 15–41)
Albumin: 3.6 g/dL (ref 3.5–5.0)
Alkaline Phosphatase: 51 U/L (ref 38–126)
Anion gap: 9 (ref 5–15)
BUN: 9 mg/dL (ref 6–20)
CO2: 24 mmol/L (ref 22–32)
Calcium: 9.2 mg/dL (ref 8.9–10.3)
Chloride: 104 mmol/L (ref 98–111)
Creatinine, Ser: 0.68 mg/dL (ref 0.44–1.00)
GFR, Estimated: >60 mL/min (ref >60)
Glucose, Bld: 103 mg/dL — ABNORMAL HIGH (ref 70–99)
Potassium: 3.7 mmol/L (ref 3.5–5.1)
Sodium: 137 mmol/L (ref 135–145)
Total Bilirubin: 0.5 mg/dL (ref 0.0–1.2)
Total Protein: 7 g/dL (ref 6.5–8.1)

## 2024-07-21 LAB — HCG, SERUM, QUALITATIVE: Preg, Serum: NEGATIVE

## 2024-07-21 LAB — LIPASE, BLOOD: Lipase: 38 U/L (ref 11–51)

## 2024-07-21 MED ORDER — LIDOCAINE VISCOUS HCL 2 % MT SOLN
15.0000 mL | Freq: Once | OROMUCOSAL | Status: AC
  Administered 2024-07-21: 15 mL via ORAL
  Filled 2024-07-21: qty 15

## 2024-07-21 MED ORDER — ALUM & MAG HYDROXIDE-SIMETH 200-200-20 MG/5ML PO SUSP
30.0000 mL | Freq: Once | ORAL | Status: AC
  Administered 2024-07-21: 30 mL via ORAL
  Filled 2024-07-21: qty 30

## 2024-07-21 MED ORDER — GI COCKTAIL ~~LOC~~: 30.0000 mL | Freq: Three times a day (TID) | ORAL | 0 refills | Status: DC | PRN

## 2024-07-21 NOTE — ED Provider Notes (Signed)
 [place] EMERGENCY DEPARTMENT AT [facility] Provider Note   CSN: [id number] Arrival date & time: 07/20/24  2238     Patient presents with: Heartburn   Casey Larson is a 43 y.o. female.   Patient is a 43 year old female with history of prior appendectomy and cholecystectomy.  Patient presenting today with complaints of heartburn occurring intermittently for the past several days.  She describes a discomfort to the left upper quadrant and radiating into her back.  The pain seems to come and go, but is not associated with food.  No fevers or chills.  No bowel or bladder complaints.  No aggravating or alleviating factors.       Prior to Admission medications   Medication Sig Start Date End Date Taking? Authorizing Provider  ibuprofen  (ADVIL ) 200 MG tablet Take 200 mg by mouth every 6 (six) hours as needed.    [provider]  omeprazole  (PRILOSEC) 20 MG capsule Take 20 mg by mouth 2 (two) times daily before a meal.    [provider]    Allergies: Codeine and Penicillins    Review of Systems  All other systems reviewed and are negative.   Updated Vital Signs BP 114/78   Pulse 72   Temp 98.1 F (36.7 C) (Oral)   Resp 16   Ht 5' 4 (1.626 m)   Wt 61.8 kg   SpO2 100%   BMI 23.39 kg/m   Physical Exam Vitals and nursing note reviewed.  Constitutional:      General: She is not in acute distress.    Appearance: She is well-developed. She is not diaphoretic.  HENT:     Head: Normocephalic and atraumatic.  Cardiovascular:     Rate and Rhythm: Normal rate and regular rhythm.     Heart sounds: No murmur heard.    No friction rub. No gallop.  Pulmonary:     Effort: Pulmonary effort is normal. No respiratory distress.     Breath sounds: Normal breath sounds. No wheezing.  Abdominal:     General: Bowel sounds are normal. There is no distension.     Palpations: Abdomen is soft.     Tenderness: There is abdominal tenderness. There is no  right CVA tenderness, left CVA tenderness, guarding or rebound.  Musculoskeletal:        General: Normal range of motion.     Cervical back: Normal range of motion and neck supple.  Skin:    General: Skin is warm and dry.  Neurological:     General: No focal deficit present.     Mental Status: She is alert and oriented to person, place, and time.     (all labs ordered are listed, but only abnormal results are displayed) Labs Reviewed  COMPREHENSIVE METABOLIC PANEL WITH GFR  CBC WITH DIFFERENTIAL/PLATELET  LIPASE, BLOOD  URINALYSIS, ROUTINE W REFLEX MICROSCOPIC  HCG, SERUM, QUALITATIVE    EKG: None  Radiology: No results found.   Procedures   Medications Ordered in the ED  alum & mag hydroxide-simeth (MAALOX/MYLANTA) 200-200-20 MG/5ML suspension 30 mL (has no administration in time range)    And  lidocaine  (XYLOCAINE ) 2 % viscous mouth solution 15 mL (has no administration in time range)                                    Medical Decision Making Amount and/or Complexity of Data Reviewed Labs: ordered.  Radiology: ordered.  Risk OTC drugs. Prescription drug management.   Patient is a 43 year old female presenting with complaints of epigastric pain/burning.  Patient arrives here with stable vital signs and is afebrile.  There is some tenderness to the left upper quadrant, but no peritoneal signs.  Laboratory studies obtained including CBC, CMP, and lipase, all of which are unremarkable.  Pregnancy test is negative.  Renal CT obtained showing no renal calculus or other acute intra-abdominal process.  She did receive a GI cocktail with some relief.  Cause of her discomfort unclear, but given the burning description and slight improvement with GI cocktail, I suspect possibly gastritis or GERD.  She does take omeprazole  as needed and I will have her take 20 mg twice daily for the next 2 weeks.  I will also prescribe GI cocktail for home use.  To return as needed and  follow-up with primary doctor if not improving.     Final diagnoses:  None    ED Discharge Orders     None          Geroldine Berg, MD 07/21/24 [phone]

## 2024-07-21 NOTE — Discharge Instructions (Signed)
 Begin taking omeprazole  20 mg twice daily for the next 2 weeks, then daily thereafter.  GI cocktail as prescribed as needed for discomfort.  Follow-up with primary doctor if symptoms are not improving in the next few days.

## 2024-07-22 ENCOUNTER — Other Ambulatory Visit: Payer: Self-pay

## 2024-07-22 ENCOUNTER — Encounter (HOSPITAL_COMMUNITY): Payer: Self-pay

## 2024-07-22 ENCOUNTER — Emergency Department (HOSPITAL_COMMUNITY)

## 2024-07-22 ENCOUNTER — Emergency Department (HOSPITAL_COMMUNITY)
Admission: EM | Admit: 2024-07-22 | Discharge: 2024-07-22 | Disposition: A | Discharge status: Home / Self Care | Attending: Emergency Medicine | Admitting: Emergency Medicine

## 2024-07-22 DIAGNOSIS — Q438 Other specified congenital malformations of intestine: Secondary | ICD-10-CM | POA: Diagnosis not present

## 2024-07-22 DIAGNOSIS — R9431 Abnormal electrocardiogram [ECG] [EKG]: Secondary | ICD-10-CM | POA: Diagnosis not present

## 2024-07-22 DIAGNOSIS — K5900 Constipation, unspecified: Secondary | ICD-10-CM | POA: Diagnosis not present

## 2024-07-22 DIAGNOSIS — R1012 Left upper quadrant pain: Secondary | ICD-10-CM | POA: Insufficient documentation

## 2024-07-22 DIAGNOSIS — R1013 Epigastric pain: Secondary | ICD-10-CM | POA: Diagnosis not present

## 2024-07-22 DIAGNOSIS — R109 Unspecified abdominal pain: Secondary | ICD-10-CM | POA: Diagnosis not present

## 2024-07-22 DIAGNOSIS — R079 Chest pain, unspecified: Secondary | ICD-10-CM | POA: Diagnosis not present

## 2024-07-22 LAB — CBC WITH DIFFERENTIAL/PLATELET
Abs Immature Granulocytes: 0.01 K/uL (ref 0.00–0.07)
Basophils Absolute: 0 K/uL (ref 0.0–0.1)
Basophils Relative: 1 %
Eosinophils Absolute: 0.1 K/uL (ref 0.0–0.5)
Eosinophils Relative: 2 %
HCT: 34.5 % — ABNORMAL LOW (ref 36.0–46.0)
Hemoglobin: 11.6 g/dL — ABNORMAL LOW (ref 12.0–15.0)
Immature Granulocytes: 0 %
Lymphocytes Relative: 37 %
Lymphs Abs: 2.8 K/uL (ref 0.7–4.0)
MCH: 30.1 pg (ref 26.0–34.0)
MCHC: 33.6 g/dL (ref 30.0–36.0)
MCV: 89.4 fL (ref 80.0–100.0)
Monocytes Absolute: 0.4 K/uL (ref 0.1–1.0)
Monocytes Relative: 5 %
Neutro Abs: 4.3 K/uL (ref 1.7–7.7)
Neutrophils Relative %: 55 %
Platelets: 292 K/uL (ref 150–400)
RBC: 3.86 MIL/uL — ABNORMAL LOW (ref 3.87–5.11)
RDW: 13.5 % (ref 11.5–15.5)
WBC: 7.7 K/uL (ref 4.0–10.5)
nRBC: 0 % (ref 0.0–0.2)

## 2024-07-22 LAB — COMPREHENSIVE METABOLIC PANEL WITH GFR
ALT: 9 U/L (ref 0–44)
AST: 10 U/L — ABNORMAL LOW (ref 15–41)
Albumin: 3.4 g/dL — ABNORMAL LOW (ref 3.5–5.0)
Alkaline Phosphatase: 46 U/L (ref 38–126)
Anion gap: 9 (ref 5–15)
BUN: 7 mg/dL (ref 6–20)
CO2: 22 mmol/L (ref 22–32)
Calcium: 8.4 mg/dL — ABNORMAL LOW (ref 8.9–10.3)
Chloride: 108 mmol/L (ref 98–111)
Creatinine, Ser: 0.68 mg/dL (ref 0.44–1.00)
GFR, Estimated: >60 mL/min (ref >60)
Glucose, Bld: 102 mg/dL — ABNORMAL HIGH (ref 70–99)
Potassium: 3.8 mmol/L (ref 3.5–5.1)
Sodium: 139 mmol/L (ref 135–145)
Total Bilirubin: 0.6 mg/dL (ref 0.0–1.2)
Total Protein: 6.4 g/dL — ABNORMAL LOW (ref 6.5–8.1)

## 2024-07-22 LAB — TROPONIN I (HIGH SENSITIVITY)
Troponin I (High Sensitivity): <2 ng/L (ref <18)
Troponin I (High Sensitivity): <2 ng/L (ref <18)

## 2024-07-22 LAB — LIPASE, BLOOD: Lipase: 34 U/L (ref 11–51)

## 2024-07-22 MED ORDER — ONDANSETRON HCL 4 MG/2ML IJ SOLN
4.0000 mg | Freq: Once | INTRAMUSCULAR | Status: AC
  Administered 2024-07-22: 4 mg via INTRAVENOUS
  Filled 2024-07-22: qty 2

## 2024-07-22 MED ORDER — KETOROLAC TROMETHAMINE 30 MG/ML IJ SOLN
30.0000 mg | Freq: Once | INTRAMUSCULAR | Status: AC
  Administered 2024-07-22: 30 mg via INTRAVENOUS
  Filled 2024-07-22: qty 1

## 2024-07-22 MED ORDER — ALUM & MAG HYDROXIDE-SIMETH 200-200-20 MG/5ML PO SUSP
30.0000 mL | Freq: Once | ORAL | Status: AC
  Administered 2024-07-22: 30 mL via ORAL
  Filled 2024-07-22: qty 30

## 2024-07-22 MED ORDER — MORPHINE SULFATE (PF) 4 MG/ML IV SOLN
4.0000 mg | Freq: Once | INTRAVENOUS | Status: AC
  Administered 2024-07-22: 2 mg via INTRAVENOUS
  Filled 2024-07-22: qty 1

## 2024-07-22 MED ORDER — IOHEXOL 350 MG/ML SOLN
100.0000 mL | Freq: Once | INTRAVENOUS | Status: AC | PRN
  Administered 2024-07-22: 100 mL via INTRAVENOUS

## 2024-07-22 MED ORDER — SODIUM CHLORIDE 0.9 % IV BOLUS
1000.0000 mL | Freq: Once | INTRAVENOUS | Status: AC
  Administered 2024-07-22: 1000 mL via INTRAVENOUS

## 2024-07-22 MED ORDER — LIDOCAINE VISCOUS HCL 2 % MT SOLN
15.0000 mL | Freq: Once | OROMUCOSAL | Status: AC
  Administered 2024-07-22: 15 mL via ORAL
  Filled 2024-07-22: qty 15

## 2024-07-22 NOTE — Discharge Instructions (Signed)
 Begin taking Prilosec 20 mg twice daily for the next 2 weeks, then once daily thereafter.  Follow-up with your primary doctor if symptoms are not improving in the next few days.

## 2024-07-22 NOTE — ED Provider Notes (Signed)
 [place] EMERGENCY DEPARTMENT AT [facility] Provider Note   CSN: [id number] Arrival date & time: 07/22/24  9853     Patient presents with: No chief complaint on file.   Casey Larson is a 43 y.o. female.   Patient is a 43 year old female with no significant past medical history.  Patient presenting today with complaints of a burning sensation to her left upper quadrant and radiating into her back.  The symptoms have been present for the past several days.  She was seen by myself approximately 24 hours ago with the same complaint.  She underwent laboratory studies and renal CT, all of which were unremarkable.  She was discharged with a GI cocktail which she apparently did not fill.  She has been taking Tums with minimal relief.  No shortness of breath.  No fevers or chills.       Prior to Admission medications   Medication Sig Start Date End Date Taking? Authorizing Provider  Alum & Mag Hydroxide-Simeth (GI COCKTAIL) SUSP suspension Take 30 mLs by mouth 3 (three) times daily as needed for indigestion. Shake well. 07/21/24   Geroldine Berg, MD  ibuprofen  (ADVIL ) 200 MG tablet Take 200 mg by mouth every 6 (six) hours as needed.    [provider]  omeprazole  (PRILOSEC) 20 MG capsule Take 20 mg by mouth 2 (two) times daily before a meal.    [provider]    Allergies: Codeine and Penicillins    Review of Systems  All other systems reviewed and are negative.   Updated Vital Signs BP 96/78   Pulse 66   Temp 97.8 F (36.6 C)   Resp 18   Ht 5' 4 (1.626 m)   Wt 61.8 kg   SpO2 99%   BMI 23.39 kg/m   Physical Exam Vitals and nursing note reviewed.  Constitutional:      General: She is not in acute distress.    Appearance: She is well-developed. She is not diaphoretic.  HENT:     Head: Normocephalic and atraumatic.  Cardiovascular:     Rate and Rhythm: Normal rate and regular rhythm.     Heart sounds: No murmur heard.    No friction rub.  No gallop.  Pulmonary:     Effort: Pulmonary effort is normal. No respiratory distress.     Breath sounds: Normal breath sounds. No wheezing.  Abdominal:     General: Bowel sounds are normal. There is no distension.     Palpations: Abdomen is soft.     Tenderness: There is no abdominal tenderness.  Musculoskeletal:        General: Normal range of motion.     Cervical back: Normal range of motion and neck supple.  Skin:    General: Skin is warm and dry.     Comments: No rash noted to the left flank or torso.  Neurological:     General: No focal deficit present.     Mental Status: She is alert and oriented to person, place, and time.     (all labs ordered are listed, but only abnormal results are displayed) Labs Reviewed - No data to display  EKG: EKG Interpretation Date/Time:  Thursday July 22 2024 03:13:44 EDT Ventricular Rate:  66 PR Interval:  146 QRS Duration:  87 QT Interval:  396 QTC Calculation: 415 R Axis:   64  Text Interpretation: Sinus rhythm Normal ECG Confirmed by Geroldine Berg ([id number]) on 07/22/2024 3:22:40 AM  Radiology: CT Renal  Stone Study Result Date: 07/21/2024 CLINICAL DATA:  Flank pain EXAM: CT ABDOMEN AND PELVIS WITHOUT CONTRAST TECHNIQUE: Multidetector CT imaging of the abdomen and pelvis was performed following the standard protocol without IV contrast. RADIATION DOSE REDUCTION: This exam was performed according to the departmental dose-optimization program which includes automated exposure control, adjustment of the mA and/or kV according to patient size and/or use of iterative reconstruction technique. COMPARISON:  06/02/2019 FINDINGS: Lower chest: No acute abnormality. Hepatobiliary: No focal liver abnormality is seen. Status post cholecystectomy. No biliary dilatation. Pancreas: Unremarkable. No pancreatic ductal dilatation or surrounding inflammatory changes. Spleen: Normal in size without focal abnormality. Adrenals/Urinary Tract: Adrenal glands are  within normal limits. Kidneys are well visualized bilaterally. No renal calculi or obstructive changes are seen. The bladder is within normal limits. Stomach/Bowel: No obstructive or inflammatory changes of the colon are seen. The appendix has been surgically removed. Small bowel and stomach are within normal limits. Vascular/Lymphatic: No significant vascular findings are present. No enlarged abdominal or pelvic lymph nodes. Reproductive: Uterus and bilateral adnexa are unremarkable. Other: Mild free fluid is noted likely physiologic in nature. No hernia is noted. Musculoskeletal: No acute or significant osseous findings. IMPRESSION: No acute abnormality noted. Electronically Signed   By: Oneil Devonshire M.D.   On: 07/21/2024 03:45     Procedures   Medications Ordered in the ED  sodium chloride  0.9 % bolus 1,000 mL (has no administration in time range)  ondansetron  (ZOFRAN ) injection 4 mg (has no administration in time range)  morphine  (PF) 4 MG/ML injection 4 mg (has no administration in time range)  ketorolac  (TORADOL ) 30 MG/ML injection 30 mg (has no administration in time range)  alum & mag hydroxide-simeth (MAALOX/MYLANTA) 200-200-20 MG/5ML suspension 30 mL (has no administration in time range)    And  lidocaine  (XYLOCAINE ) 2 % viscous mouth solution 15 mL (has no administration in time range)                                    Medical Decision Making Amount and/or Complexity of Data Reviewed Labs: ordered. Radiology: ordered.  Risk OTC drugs. Prescription drug management.   Patient is a 43 year old female presenting with left upper quadrant pain that she describes as a burning.  Patient arrives here with stable vital signs and is afebrile.  Physical examination reveals mild tenderness to the left upper quadrant, but is otherwise unremarkable.  Laboratory studies obtained including CBC, CMP, and troponin x 2, all of which are basically normal.  Chest x-ray is normal.  CT scan of  the abdomen and pelvis is normal.  CTA of the chest is normal showing no evidence for pulmonary embolism.  Patient has received IV fluids and Toradol  with minimal improvement.  The cause of her abdominal discomfort is unclear, but nothing appears emergent.  At this point, I feel as though she can safely be discharged my advice is to take her Prilosec twice daily for the next 2 weeks, then once daily thereafter.  To follow-up with primary doctor if not improving.     Final diagnoses:  None    ED Discharge Orders     None          Geroldine Berg, MD 07/22/24 [phone]

## 2024-07-22 NOTE — ED Notes (Signed)
 Pt did not react well to morphine , felt like she was going to pass out. Med stopped with 0.6 mls left. MD made aware to reaction. Med wasted with witness.

## 2024-07-22 NOTE — ED Triage Notes (Signed)
 POV from home. Cc of heartburn unresolved form yesterday. 7/10 Has only taken tums. Did not get GI cocktail script filled

## 2024-07-22 NOTE — ED Notes (Signed)
 Patient transported to CT

## 2024-11-08 ENCOUNTER — Other Ambulatory Visit: Payer: Self-pay

## 2024-11-08 ENCOUNTER — Emergency Department
Admission: EM | Admit: 2024-11-08 | Discharge: 2024-11-08 | Disposition: A | Discharge status: Home / Self Care | Attending: Emergency Medicine | Admitting: Emergency Medicine

## 2024-11-08 ENCOUNTER — Emergency Department

## 2024-11-08 DIAGNOSIS — R079 Chest pain, unspecified: Secondary | ICD-10-CM | POA: Diagnosis not present

## 2024-11-08 DIAGNOSIS — R0789 Other chest pain: Secondary | ICD-10-CM | POA: Insufficient documentation

## 2024-11-08 LAB — COMPREHENSIVE METABOLIC PANEL WITH GFR
ALT: 7 U/L (ref 0–44)
AST: 13 U/L — ABNORMAL LOW (ref 15–41)
Albumin: 4.3 g/dL (ref 3.5–5.0)
Alkaline Phosphatase: 62 U/L (ref 38–126)
Anion gap: 9 (ref 5–15)
BUN: 7 mg/dL (ref 6–20)
CO2: 24 mmol/L (ref 22–32)
Calcium: 9 mg/dL (ref 8.9–10.3)
Chloride: 102 mmol/L (ref 98–111)
Creatinine, Ser: 0.71 mg/dL (ref 0.44–1.00)
GFR, Estimated: >60 mL/min (ref >60)
Glucose, Bld: 105 mg/dL — ABNORMAL HIGH (ref 70–99)
Potassium: 3.8 mmol/L (ref 3.5–5.1)
Sodium: 136 mmol/L (ref 135–145)
Total Bilirubin: 0.4 mg/dL (ref 0.0–1.2)
Total Protein: 7.5 g/dL (ref 6.5–8.1)

## 2024-11-08 LAB — CBC
HCT: 39 % (ref 36.0–46.0)
Hemoglobin: 13.3 g/dL (ref 12.0–15.0)
MCH: 29.8 pg (ref 26.0–34.0)
MCHC: 34.1 g/dL (ref 30.0–36.0)
MCV: 87.2 fL (ref 80.0–100.0)
Platelets: 300 K/uL (ref 150–400)
RBC: 4.47 MIL/uL (ref 3.87–5.11)
RDW: 13.4 % (ref 11.5–15.5)
WBC: 7.1 K/uL (ref 4.0–10.5)
nRBC: 0 % (ref 0.0–0.2)

## 2024-11-08 LAB — LIPASE, BLOOD: Lipase: 37 U/L (ref 11–51)

## 2024-11-08 LAB — TROPONIN T, HIGH SENSITIVITY: Troponin T High Sensitivity: <15 ng/L (ref 0–19)

## 2024-11-08 MED ORDER — ASPIRIN 81 MG PO CHEW: 324.0000 mg | CHEWABLE_TABLET | Freq: Once | ORAL | Status: DC

## 2024-11-08 MED ORDER — LIDOCAINE VISCOUS HCL 2 % MT SOLN
15.0000 mL | Freq: Once | OROMUCOSAL | Status: AC
  Administered 2024-11-08: 15 mL via ORAL
  Filled 2024-11-08: qty 15

## 2024-11-08 MED ORDER — ALUM & MAG HYDROXIDE-SIMETH 200-200-20 MG/5ML PO SUSP
30.0000 mL | Freq: Once | ORAL | Status: AC
  Administered 2024-11-08: 30 mL via ORAL
  Filled 2024-11-08: qty 30

## 2024-11-08 NOTE — ED Provider Notes (Signed)
 [facility] Provider Note    Event Date/Time   First MD Initiated Contact with Patient 11/08/24 1346     (approximate)   History   Chest Pain   HPI  Casey Larson is a 43 y.o. female with history of acid reflux on omeprazole  who presents with complaints of chest discomfort, primarily left-sided chest discomfort.  She has had this several times before, most recently in July of this year per review of records, at that time she had normal CT angiography.  She is frustrated that this keeps occurring without clear etiology.  She is feeling improved here no shortness of breath.  No calf pain or   Physical Exam   Triage Vital Signs: ED Triage Vitals  Encounter Vitals Group     BP 11/08/24 1256 122/79     Girls Systolic BP Percentile --      Girls Diastolic BP Percentile --      Boys Systolic BP Percentile --      Boys Diastolic BP Percentile --      Pulse Rate 11/08/24 1256 (!) 103     Resp 11/08/24 1256 17     Temp 11/08/24 1256 97.7 F (36.5 C)     Temp Source 11/08/24 1256 Oral     SpO2 11/08/24 1256 100 %     Weight 11/08/24 1257 59 kg (130 lb)     Height 11/08/24 1257 1.626 m (5' 4)     Head Circumference --      Peak Flow --      Pain Score 11/08/24 1257 6     Pain Loc --      Pain Education --      Exclude from Growth Chart --     Most recent vital signs: Vitals:   11/08/24 1256  BP: 122/79  Pulse: (!) 103  Resp: 17  Temp: 97.7 F (36.5 C)  SpO2: 100%     General: Awake, no distress.  CV:  Good peripheral perfusion.  No chest wall tenderness palpation Resp:  Normal effort.  Abd:  No distention.  Mild epigastric discomfort Other:     ED Results / Procedures / Treatments   Labs (all labs ordered are listed, but only abnormal results are displayed) Labs Reviewed  COMPREHENSIVE METABOLIC PANEL WITH GFR - Abnormal; Notable for the following components:      Result Value   Glucose, Bld 105 (*)    AST 13 (*)    All other  components within normal limits  CBC  LIPASE, BLOOD  POC URINE PREG, ED  TROPONIN T, HIGH SENSITIVITY  TROPONIN T, HIGH SENSITIVITY     EKG  ED ECG REPORT I, Lamar Price, the attending physician, personally viewed and interpreted this ECG.  Date: 11/08/2024  Rhythm: normal sinus rhythm QRS Axis: normal Intervals: normal ST/T Wave abnormalities: normal Narrative Interpretation: no evidence of acute ischemia    RADIOLOGY Chest x-ray viewed interpret by me, no acute abnormality    PROCEDURES:  Critical Care performed:   Procedures   MEDICATIONS ORDERED IN ED: Medications  alum & mag hydroxide-simeth (MAALOX/MYLANTA) 200-200-20 MG/5ML suspension 30 mL (30 mLs Oral Given 11/08/24 1433)    And  lidocaine  (XYLOCAINE ) 2 % viscous mouth solution 15 mL (15 mLs Oral Given 11/08/24 1433)     IMPRESSION / MDM / ASSESSMENT AND PLAN / ED COURSE  I reviewed the triage vital signs and the nursing notes. Patient's presentation is most consistent with acute presentation  with potential threat to life or bodily function.  Patient presents with chest discomfort as detailed above, differential includes ACS, esophagitis, musculoskeletal pain.   EKG is reassuring, mild tachycardia noted but given recent reassuring CT angiography negative for PE feel this is less likely.  Pending high sensitive troponin and labs.  Chest x-ray without evidence of pneumothorax or pneumonia  Will trial GI cocktail while we await lab  High sensitive troponin is normal.  Patient had total resolution of discomfort with the GI cocktail which is suspicious for possible esophagitis however given recurrent episodes feel she needs cardiac workup as well, referral to cardiology and GI made.  Appropriate for discharge at this time, no indication for admission        FINAL CLINICAL IMPRESSION(S) / ED DIAGNOSES   Final diagnoses:  Atypical chest pain     Rx / DC Orders   ED Discharge Orders           Ordered    Ambulatory referral to Cardiology       Comments: If you have not heard from the Cardiology office within the next 72 hours please call [phone].   11/08/24 1502    Ambulatory referral to Gastroenterology        11/08/24 1502             Note:  This document was prepared using Dragon voice recognition software and may include unintentional dictation errors.   Arlander Charleston, MD 11/08/24 [phone]

## 2024-11-08 NOTE — ED Triage Notes (Signed)
 Pt sts that she was in the car and she started to have chest pressure that goes to her back.

## 2024-12-28 NOTE — Progress Notes (Unsigned)
 "   12/29/2024 Casey Larson [id number] December 07, 1981  Gastroenterology Office Note    Referring Provider: Shona Norleen PEDLAR, MD Primary Care Physician:  Shona Norleen PEDLAR, MD  Primary GI Provider: Jinny Carmine, MD    Chief Complaint   Chief Complaint  Patient presents with   New Patient (Initial Visit)    Casey Larson- taking omeprazole -epigastric pain-burning-no nausea-  Bloating  and constipation-not currently taking fiber-takes mirlax here and there- BM are sometimes 3-4 days before having one-     History of Present Illness   Casey Larson is a 44 y.o. female presenting today at the request of Shona Norleen PEDLAR, MD due to GERD.  Discussed the use of AI scribe software for clinical note transcription with the patient, who gave verbal consent to proceed.  Patient with longstanding, intermittent episodes of acid reflux and epigastric pain described as burning or pain under the left breast. Symptoms are episodic, sometimes severe enough to prompt multiple emergency room visits, most recently in November 2025 for chest pain. Cardiac workups, including EKGs and heart monitoring, have consistently been normal. Occasional nocturnal pain and burning. No dysphagia, nausea, or vomiting.   Upper endoscopy performed in 2020. Gallbladder removed since that time. Previously prescribed omeprazole  40 mg twice daily in 2020, but self-reduced to 40 mg daily due to perceived high dose. Occasional ibuprofen  use for headaches, most recently over the past two days, but not used regularly.  Dietary intake is limited due to fear of symptom recurrence. Patient reports current weight is 125 pounds, which is below her usual weight. Avoids spicy and fried foods, prefers bland meals, rarely consumes dairy, and avoids milk. Limits sodas, coffee, and tea, primarily drinks water, but occasionally has a can of [employer] per day. No alcohol use. Uncertain about lactose intolerance.  Occasional bloating and constipation, with  bowel movements typically once daily but sometimes 2-4 days between. Has used Miralax in the past, not regularly, and does not use fiber supplements. Denies melena or hematochezia.   Seen in the emergency room on 11/08/2024 with complaints of chest discomfort.  EKG normal sinus rhythm.  Chest x-ray without evidence of pneumothorax or pneumonia. Treated with GI cocktail with total resolution of discomfort.   05/13/2019 EGD - No gross lesions in proximal/middle esophagus. Biopsied.  - Salmon-colored mucosal island suspicious for Barrett's esophagus. Biopsied.  - 2 cm hiatal hernia. - No gross lesions in the stomach. Biopsied for HP. - Erythematous duodenopathy in the bulb but no other gross lesions in the second portion of the duodenum and in the area of the papilla. Biopsies were taken with a cold forceps for histology in the duodenal bulb, in the first portion of the duodenum and in the second portion of the duodenum for enteropathy rule out.  Past Medical History:  Diagnosis Date   Abdominal pain    epigastric pain to left side chest,back,arm   Acid reflux    Arthritis    as a child   History of hiatal hernia     Past Surgical History:  Procedure Laterality Date   APPENDECTOMY     CESAREAN SECTION  3x last being 01/12   CHOLECYSTECTOMY N/A 07/09/2019   Procedure: LAPAROSCOPIC CHOLECYSTECTOMY;  Surgeon: Signe Mitzie LABOR, MD;  Location: [facility] OR;  Service: General;  Laterality: N/A;   EYE SURGERY     lasik   LAPAROSCOPIC APPENDECTOMY N/A 12/31/2017   Procedure: APPENDECTOMY LAPAROSCOPIC;  Surgeon: Mavis Anes, MD;  Location: [facility] ORS;  Service: General;  Laterality: N/A;   WISDOM TOOTH EXTRACTION      Current Outpatient Medications  Medication Sig Dispense Refill   famotidine  (PEPCID ) 20 MG tablet Take 1 tablet (20 mg total) by mouth at bedtime. 30 tablet 2   ibuprofen  (ADVIL ) 200 MG tablet Take 200 mg by mouth every 6 (six) hours as needed.     pantoprazole  (PROTONIX ) 40 MG tablet  Take 1 tablet (40 mg total) by mouth daily. 30 tablet 2   No current facility-administered medications for this visit.    Allergies as of 12/29/2024 - Review Complete 11/08/2024  Allergen Reaction Noted   Codeine  11/22/2011   Penicillins Hives and Rash 11/22/2011   Dilaudid  [hydromorphone ] Anxiety and Other (See Comments) 07/22/2024   Morphine  Anxiety and Other (See Comments) 07/22/2024    Family History  Problem Relation Age of Onset   Heart disease Father    Diabetes Father    Breast cancer Maternal Aunt    Cancer Maternal Grandmother        lung cancer   Breast cancer Cousin    Colon cancer Neg Hx    Esophageal cancer Neg Hx    Inflammatory bowel disease Neg Hx    Liver disease Neg Hx    Pancreatic cancer Neg Hx    Rectal cancer Neg Hx    Stomach cancer Neg Hx     Social History   Socioeconomic History   Marital status: Married    Spouse name: Not on file   Number of children: 3   Years of education: Not on file   Highest education level: Not on file  Occupational History    Employer: UNEMPLOYED  Tobacco Use   Smoking status: Never   Smokeless tobacco: Never  Vaping Use   Vaping status: Never Used  Substance and Sexual Activity   Alcohol use: No   Drug use: No   Sexual activity: Yes    Birth control/protection: I.U.D.    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   Married.         Social Drivers of Health   Tobacco Use: Low Risk (12/29/2024)   Patient History    Smoking Tobacco Use: Never    Smokeless Tobacco Use: Never    Passive Exposure: Not on file  Financial Resource Strain: Not on file  Food Insecurity: Not on file  Transportation Needs: Not on file  Physical Activity: Not on file  Stress: Not on file  Social Connections: Not on file  Intimate Partner Violence: Not  on file  Depression (PHQ2-9): Not on file  Alcohol Screen: Not on file  Housing: Not on file  Utilities: Not on file  Health Literacy: Not on file     RELEVANT GI HISTORY, IMAGING AND LABS: CBC    Component Value Date/Time   WBC 7.1 11/08/2024 1400   RBC 4.47 11/08/2024 1400   HGB 13.3 11/08/2024 1400   HCT 39.0 11/08/2024 1400   PLT 300 11/08/2024 1400   MCV 87.2 11/08/2024 1400   MCH 29.8 11/08/2024 1400   MCHC 34.1 11/08/2024 1400   RDW 13.4 11/08/2024 1400   LYMPHSABS 2.8 07/22/2024 0338   MONOABS 0.4 07/22/2024 0338   EOSABS 0.1 07/22/2024 0338   BASOSABS 0.0 07/22/2024 0338  Recent Labs    02/12/24 2210 07/21/24 0124 07/22/24 0338 11/08/24 1400  HGB 13.9 12.3 11.6* 13.3    CMP     Component Value Date/Time   NA 136 11/08/2024 1400   K 3.8 11/08/2024 1400   CL 102 11/08/2024 1400   CO2 24 11/08/2024 1400   GLUCOSE 105 (H) 11/08/2024 1400   BUN 7 11/08/2024 1400   CREATININE 0.71 11/08/2024 1400   CREATININE 0.63 03/10/2014 1606   CALCIUM 9.0 11/08/2024 1400   PROT 7.5 11/08/2024 1400   ALBUMIN 4.3 11/08/2024 1400   AST 13 (L) 11/08/2024 1400   ALT 7 11/08/2024 1400   ALKPHOS 62 11/08/2024 1400   BILITOT 0.4 11/08/2024 1400   GFRNONAA >60 11/08/2024 1400   GFRAA >60 05/12/2019 2306      Latest Ref Rng & Units 11/08/2024    2:00 PM 07/22/2024    3:38 AM 07/21/2024    1:24 AM  Hepatic Function  Total Protein 6.5 - 8.1 g/dL 7.5  6.4  7.0   Albumin 3.5 - 5.0 g/dL 4.3  3.4  3.6   AST 15 - 41 U/L 13  10  11    ALT 0 - 44 U/L 7  9  8    Alk Phosphatase 38 - 126 U/L 62  46  51   Total Bilirubin 0.0 - 1.2 mg/dL 0.4  0.6  0.5       Review of Systems   All systems reviewed and negative except where noted in HPI.    Physical Exam  BP 118/77   Pulse 77   Temp 98.2 F (36.8 C)   Ht 5' 4 (1.626 m)   Wt 125 lb 6.4 oz (56.9 kg)   LMP 12/06/2024   SpO2 99%   BMI 21.52 kg/m  Patient's last menstrual period was 12/06/2024. General:   Alert and  oriented. Pleasant and cooperative. Well-nourished and well-developed.  In no acute distress. Head:  Normocephalic and atraumatic. Eyes:  Without icterus Ears:  Normal auditory acuity. Lungs:  Respirations even and unlabored.  Clear throughout to auscultation.   No wheezes, crackles, or rhonchi. No acute distress. Heart:  Regular rate and rhythm; no murmurs, clicks, rubs, or gallops. Abdomen:  Normal bowel sounds.  No bruits.  Mild TTP epigastric area, soft, non-distended without masses, hepatosplenomegaly or hernias noted.  No guarding or rebound tenderness.  Rectal:  Deferred. Msk:  Symmetrical without gross deformities. Normal posture. Extremities:  Without edema. Neurologic:  Alert and  oriented x4;  grossly normal neurologically. Skin:  Intact without significant lesions or rashes. Psych:  Alert and cooperative. Normal mood and affect.   Assessment & Plan   ASANI DENISTON is a 44 y.o. female presenting today with GERD, epigastric pain, constipation, bloating.  GERD. Epigastric pain and bloating.  - will check celiac panel - continue lifestyle modifications, avoid trigger foods, NSAIDS, ETOH; do not eat meals 3 hr prior to bed. - Stop omeprazole . Start pantoprazole  (Protonix ) in the morning, 30 minutes before eating. - Added famotidine  at night. - Schedule EGD in the near future. I discussed risks of EGD with patient today, including risk of sedation, bleeding or perforation. Patient provides understanding and gave verbal consent to proceed. Discussed risks of EGD, including small risk of perforation, bleeding if biopsies are taken, and anesthesia-related risks.  Chronic constipation  - will check TSH - Drink 64 ounces of Fluids Daily. - Start Miralax Mix 1 capful in a drink daily. - Start Benefiber Mix 1  TBSP in a drink daily. - If no improvement, consider Linzess or Trulance.    Grayce Bohr, DNP, AGNP-C [facility]  "

## 2024-12-29 ENCOUNTER — Ambulatory Visit: Admitting: Family Medicine

## 2024-12-29 ENCOUNTER — Encounter: Payer: Self-pay | Admitting: Family Medicine

## 2024-12-29 VITALS — BP 118/77 | HR 77 | Temp 98.2°F | Ht 64.0 in | Wt 125.4 lb

## 2024-12-29 DIAGNOSIS — K5909 Other constipation: Secondary | ICD-10-CM

## 2024-12-29 DIAGNOSIS — G8929 Other chronic pain: Secondary | ICD-10-CM

## 2024-12-29 DIAGNOSIS — R140 Abdominal distension (gaseous): Secondary | ICD-10-CM

## 2024-12-29 DIAGNOSIS — R1013 Epigastric pain: Secondary | ICD-10-CM

## 2024-12-29 DIAGNOSIS — K219 Gastro-esophageal reflux disease without esophagitis: Secondary | ICD-10-CM

## 2024-12-29 MED ORDER — FAMOTIDINE 20 MG PO TABS: 20.0000 mg | ORAL_TABLET | Freq: Every day | ORAL | 2 refills | Status: DC

## 2024-12-29 MED ORDER — PANTOPRAZOLE SODIUM 40 MG PO TBEC: 40.0000 mg | DELAYED_RELEASE_TABLET | Freq: Every day | ORAL | 2 refills | Status: AC

## 2024-12-29 NOTE — Patient Instructions (Signed)
. ° ° ° ° °  Start OTC Benefiber Powder. Mix 1 - 2 Tablespoons in 6 - 8 ounces of a Drink Once Daily. Drink 64 ounces of water / fluids Daily.   For constipation: Start OTC Miralax Powder Mix 1/2/-1 capful in 6 to 8 ounces of a drink once daily  Recommend high-fiber diet, 30 g of fiber daily Eat fruits, vegetables, and whole grains

## 2024-12-29 NOTE — Progress Notes (Addendum)
 "  Cardiology Office Note    Date:  12/30/2024  ID:  Casey Larson, DOB 10/22/1981, MRN [id number] Cardiologist: Alvan Carrier, MD { : History of Present Illness:    Casey Larson is a 44 y.o. female with past medical history of palpitations (prior monitor showing brief SVT with rare PAC's and PVC's) and GERD who presents to the office today for hospital follow-up for atypical chest pain.  She was last examined by Dr. Alvan in 07/2022 as a new patient referral for palpitations and symptoms would typically last for less than a minute. A 14-day Zio patch was recommended for further assessment and this showed predominantly normal sinus rhythm with 1 run of VT lasting for 9 beats and 1 episode of SVT for 4 beats. She did have rare PAC's and PVC's but less than 1% burden.  She was evaluated in the ED in 10/2024 for chest pain that radiated into her back. Troponin values were negative and EKG was without acute ST changes. She received a GI cocktail with resolution of symptoms and was discharged home. Was referred to follow-up with GI and Cardiology as an outpatient. Was evaluated by GI earlier this week and is scheduled for an EGD next month.   In talking with the patient today, she reports her palpitations have overall been well-controlled when compared to several years ago. She did reduce her caffeine intake in the interim. Reports having occasional episodes of chest pain which can occur at rest or with activity and unaware of any specific triggers. She remains active at baseline as she cleans houses throughout the week and does not experience chest pain or dyspnea on exertion with this. She has made dietary changes since being diagnosed with acid reflux. PPI therapy was also recently adjusted by GI. No recent orthopnea, PND or pitting edema.  Studies Reviewed:   EKG: EKG is not ordered today. EKG from 11/08/2024 is reviewed and shows sinus tachycardia, HR 102 with no acute ST changes.    Event Monitor: 08/2022   14 day monitor   Rare supraventricular ectopy in the form of isolated PACs, couplets, triplets. One run of SVT lasting 4 beats.   Rare ventricular ectopy in the form of isolated PVCs, couplets. One run of NSVT lasting 9 beats.   No symptoms reported     Patch Wear Time:  14 days and 0 hours (2023-08-31T13:41:53-0400 to 2023-09-14T13:41:57-0400)   Patient had a min HR of 50 bpm, max HR of 218 bpm, and avg HR of 81 bpm. Predominant underlying rhythm was Sinus Rhythm. 1 run of Ventricular Tachycardia occurred lasting 9 beats with a max rate of 218 bpm (avg 188 bpm). 1 run of Supraventricular  Tachycardia occurred lasting 4 beats with a max rate of 124 bpm (avg 119 bpm). Isolated SVEs were rare (<1.0%), SVE Couplets were rare (<1.0%), and SVE Triplets were rare (<1.0%). Isolated VEs were rare (<1.0%), VE Couplets were rare (<1.0%), and no VE  Triplets were present.    Physical Exam:   VS:  BP 110/64 (BP Location: Left Arm, Cuff Size: Normal)   Pulse 72   Ht 5' 5 (1.651 m)   Wt 125 lb 12.8 oz (57.1 kg)   LMP 12/06/2024   SpO2 100%   BMI 20.93 kg/m    Wt Readings from Last 3 Encounters:  12/30/24 125 lb 12.8 oz (57.1 kg)  12/29/24 125 lb 6.4 oz (56.9 kg)  11/08/24 130 lb (59 kg)     GEN: Well nourished,  well developed female appearing in no acute distress NECK: No JVD; No carotid bruits CARDIAC: RRR, no murmurs, rubs, gallops RESPIRATORY:  Clear to auscultation without rales, wheezing or rhonchi  ABDOMEN: Appears non-distended. No obvious abdominal masses. EXTREMITIES: No clubbing or cyanosis. No pitting edema.  Distal pedal pulses are 2+ bilaterally.   Assessment and Plan:   1. Atypical chest pain - Workup during prior ED evaluations has overall been reassuring with troponin values being negative and EKG tracings showing no acute ST changes. CTA in 06/2024 showed no evidence of coronary artery plaque. - Overall, her symptoms seem atypical for a  cardiac etiology. She is scheduled for an EGD next month. I encouraged her to make us  aware if she develops more exertional symptoms or does not experience improvement following her procedure as we could arrange for a Coronary CTA for further assessment as she does have a family history of CAD but no known family history of premature CAD.  2. Palpitations - Prior monitor showed predominantly normal sinus rhythm with brief tachycardia and rare PAC's and PVC's but less than 1% burden. She reports symptoms have overall been well-controlled and she has preferred to avoid medical therapy.  Addendum: She will follow-up with Cardiology as needed going forward.   Signed, Laymon CHRISTELLA Qua, PA-C   "

## 2024-12-30 ENCOUNTER — Encounter: Payer: Self-pay | Admitting: Student

## 2024-12-30 ENCOUNTER — Ambulatory Visit: Attending: Student | Admitting: Student

## 2024-12-30 ENCOUNTER — Ambulatory Visit: Admitting: Student

## 2024-12-30 VITALS — BP 110/64 | HR 72 | Ht 65.0 in | Wt 125.8 lb

## 2024-12-30 DIAGNOSIS — R0789 Other chest pain: Secondary | ICD-10-CM | POA: Diagnosis not present

## 2024-12-30 DIAGNOSIS — R002 Palpitations: Secondary | ICD-10-CM

## 2024-12-30 NOTE — Patient Instructions (Signed)
 Medication Instructions:  Your physician recommends that you continue on your current medications as directed. Please refer to the Current Medication list given to you today.  *If you need a refill on your cardiac medications before your next appointment, please call your pharmacy*  Lab Work: None If you have labs (blood work) drawn today and your tests are completely normal, you will receive your results only by: MyChart Message (if you have MyChart) OR A paper copy in the mail If you have any lab test that is abnormal or we need to change your treatment, we will call you to review the results.  Testing/Procedures: None  Follow-Up: At [facility], you and your health needs are our priority.  As part of our continuing mission to provide you with exceptional heart care, our providers are all part of one team.  This team includes your primary Cardiologist (physician) and Advanced Practice Providers or APPs (Physician Assistants and Nurse Practitioners) who all work together to provide you with the care you need, when you need it.  Your next appointment:    Follow up as needed  Provider:   You may see Alvan Carrier, MD or one of the following Advanced Practice Providers on your designated Care Team:   Laymon Qua, PA-C  Scotesia [place], [place] Olivia Pavy, [place]     We recommend signing up for the patient portal called MyChart.  Sign up information is provided on this After Visit Summary.  MyChart is used to connect with patients for Virtual Visits (Telemedicine).  Patients are able to view lab/test results, encounter notes, upcoming appointments, etc.  Non-urgent messages can be sent to your provider as well.   To learn more about what you can do with MyChart, go to [email or web address].   Other Instructions If you have recurrent chest pains, please give our office a call at [phone].

## 2024-12-31 LAB — CELIAC DISEASE AB SCREEN W/RFX
Deamidated Gliadin Abs, IgA: 10 U (ref 0–19)
Immunoglobulin A, (IgA) QN, Serum: 296 mg/dL (ref 87–352)
t-Transglutaminase (tTG) IgA: <2 U/mL (ref 0–3)

## 2024-12-31 LAB — TSH RFX ON ABNORMAL TO FREE T4: TSH: 2.59 u[IU]/mL (ref 0.450–4.500)

## 2025-01-02 ENCOUNTER — Ambulatory Visit: Payer: Self-pay | Admitting: Family Medicine

## 2025-01-27 ENCOUNTER — Other Ambulatory Visit: Payer: Self-pay | Admitting: Family Medicine

## 2025-02-02 ENCOUNTER — Encounter: Admission: RE | Payer: Self-pay | Source: Home / Self Care

## 2025-02-02 ENCOUNTER — Ambulatory Visit: Admission: RE | Admit: 2025-02-02 | Source: Home / Self Care | Admitting: Gastroenterology

## 2025-02-02 SURGERY — EGD (ESOPHAGOGASTRODUODENOSCOPY)
Anesthesia: General

## 2025-02-09 ENCOUNTER — Ambulatory Visit: Admitting: Family Medicine
